# Patient Record
Sex: Female | Born: 1965 | Race: White | Hispanic: No | Marital: Married | Smoking: Never smoker
Health system: Southern US, Community
[De-identification: ages and names within clinical notes are randomized; demographics above are authoritative.]

## PROBLEM LIST (undated history)

## (undated) DIAGNOSIS — C801 Malignant (primary) neoplasm, unspecified: Secondary | ICD-10-CM

## (undated) DIAGNOSIS — E785 Hyperlipidemia, unspecified: Secondary | ICD-10-CM

## (undated) HISTORY — DX: Hyperlipidemia, unspecified: E78.5

## (undated) HISTORY — PX: CHOLECYSTECTOMY: SHX55

---

## 1997-09-01 ENCOUNTER — Other Ambulatory Visit: Admission: RE | Admit: 1997-09-01 | Discharge: 1997-09-01 | Payer: Self-pay | Admitting: Obstetrics and Gynecology

## 1998-11-30 ENCOUNTER — Other Ambulatory Visit: Admission: RE | Admit: 1998-11-30 | Discharge: 1998-11-30 | Payer: Self-pay | Admitting: Obstetrics and Gynecology

## 2000-03-07 ENCOUNTER — Other Ambulatory Visit: Admission: RE | Admit: 2000-03-07 | Discharge: 2000-03-07 | Payer: Self-pay | Admitting: Obstetrics and Gynecology

## 2001-04-30 ENCOUNTER — Other Ambulatory Visit: Admission: RE | Admit: 2001-04-30 | Discharge: 2001-04-30 | Payer: Self-pay | Admitting: Obstetrics and Gynecology

## 2002-05-06 ENCOUNTER — Other Ambulatory Visit: Admission: RE | Admit: 2002-05-06 | Discharge: 2002-05-06 | Payer: Self-pay | Admitting: Obstetrics and Gynecology

## 2003-04-01 ENCOUNTER — Other Ambulatory Visit: Admission: RE | Admit: 2003-04-01 | Discharge: 2003-04-01 | Payer: Self-pay | Admitting: Obstetrics and Gynecology

## 2004-07-20 ENCOUNTER — Other Ambulatory Visit: Admission: RE | Admit: 2004-07-20 | Discharge: 2004-07-20 | Payer: Self-pay | Admitting: Obstetrics and Gynecology

## 2010-04-25 ENCOUNTER — Other Ambulatory Visit: Payer: Self-pay | Admitting: Obstetrics and Gynecology

## 2010-04-25 DIAGNOSIS — N6323 Unspecified lump in the left breast, lower outer quadrant: Secondary | ICD-10-CM

## 2010-04-26 ENCOUNTER — Ambulatory Visit
Admission: RE | Admit: 2010-04-26 | Discharge: 2010-04-26 | Disposition: A | Discharge status: Home / Self Care | Payer: BC Managed Care – PPO | Source: Ambulatory Visit | Attending: Obstetrics and Gynecology | Admitting: Obstetrics and Gynecology

## 2010-04-26 ENCOUNTER — Other Ambulatory Visit: Payer: Self-pay | Admitting: Obstetrics and Gynecology

## 2010-04-26 DIAGNOSIS — N6323 Unspecified lump in the left breast, lower outer quadrant: Secondary | ICD-10-CM

## 2010-04-27 ENCOUNTER — Other Ambulatory Visit: Payer: Self-pay | Admitting: Obstetrics and Gynecology

## 2010-04-27 DIAGNOSIS — N6323 Unspecified lump in the left breast, lower outer quadrant: Secondary | ICD-10-CM

## 2010-04-27 DIAGNOSIS — R922 Inconclusive mammogram: Secondary | ICD-10-CM

## 2010-05-20 ENCOUNTER — Ambulatory Visit
Admission: RE | Admit: 2010-05-20 | Discharge: 2010-05-20 | Disposition: A | Discharge status: Home / Self Care | Payer: BC Managed Care – PPO | Source: Ambulatory Visit | Attending: Obstetrics and Gynecology | Admitting: Obstetrics and Gynecology

## 2010-05-20 DIAGNOSIS — R922 Inconclusive mammogram: Secondary | ICD-10-CM

## 2010-05-20 DIAGNOSIS — N6323 Unspecified lump in the left breast, lower outer quadrant: Secondary | ICD-10-CM

## 2010-05-20 MED ORDER — GADOBENATE DIMEGLUMINE 529 MG/ML IV SOLN
14.0000 mL | Freq: Once | INTRAVENOUS | Status: AC | PRN
  Administered 2010-05-20: 14 mL via INTRAVENOUS

## 2010-05-22 ENCOUNTER — Other Ambulatory Visit: Payer: Self-pay | Admitting: Obstetrics and Gynecology

## 2010-05-22 DIAGNOSIS — R9389 Abnormal findings on diagnostic imaging of other specified body structures: Secondary | ICD-10-CM

## 2010-05-25 ENCOUNTER — Other Ambulatory Visit: Payer: Self-pay | Admitting: Diagnostic Radiology

## 2010-05-25 ENCOUNTER — Ambulatory Visit
Admission: RE | Admit: 2010-05-25 | Discharge: 2010-05-25 | Disposition: A | Discharge status: Home / Self Care | Payer: BC Managed Care – PPO | Source: Ambulatory Visit | Attending: Obstetrics and Gynecology | Admitting: Obstetrics and Gynecology

## 2010-05-25 ENCOUNTER — Other Ambulatory Visit: Payer: Self-pay | Admitting: Obstetrics and Gynecology

## 2010-05-25 DIAGNOSIS — R9389 Abnormal findings on diagnostic imaging of other specified body structures: Secondary | ICD-10-CM

## 2010-05-26 ENCOUNTER — Ambulatory Visit
Admission: RE | Admit: 2010-05-26 | Discharge: 2010-05-26 | Disposition: A | Discharge status: Home / Self Care | Payer: BC Managed Care – PPO | Source: Ambulatory Visit | Attending: Obstetrics and Gynecology | Admitting: Obstetrics and Gynecology

## 2010-05-26 DIAGNOSIS — R9389 Abnormal findings on diagnostic imaging of other specified body structures: Secondary | ICD-10-CM

## 2010-06-06 ENCOUNTER — Other Ambulatory Visit (INDEPENDENT_AMBULATORY_CARE_PROVIDER_SITE_OTHER): Payer: Self-pay | Admitting: General Surgery

## 2010-06-06 DIAGNOSIS — N63 Unspecified lump in unspecified breast: Secondary | ICD-10-CM

## 2010-06-23 ENCOUNTER — Encounter (HOSPITAL_BASED_OUTPATIENT_CLINIC_OR_DEPARTMENT_OTHER)
Admission: RE | Admit: 2010-06-23 | Discharge: 2010-06-23 | Disposition: A | Discharge status: Home / Self Care | Payer: BC Managed Care – PPO | Source: Ambulatory Visit | Attending: General Surgery | Admitting: General Surgery

## 2010-06-23 LAB — DIFFERENTIAL
Basophils Relative: 0 % (ref 0–1)
Monocytes Absolute: 0.6 10*3/uL (ref 0.1–1.0)
Monocytes Relative: 8 % (ref 3–12)
Neutro Abs: 4.3 10*3/uL (ref 1.7–7.7)

## 2010-06-23 LAB — BASIC METABOLIC PANEL
BUN: 13 mg/dL (ref 6–23)
Calcium: 9.7 mg/dL (ref 8.4–10.5)
GFR calc non Af Amer: >60 mL/min (ref >60)
Glucose, Bld: 94 mg/dL (ref 70–99)
Sodium: 138 mEq/L (ref 135–145)

## 2010-06-23 LAB — CBC
HCT: 37 % (ref 36.0–46.0)
Hemoglobin: 12.7 g/dL (ref 12.0–15.0)
MCH: 30.2 pg (ref 26.0–34.0)
MCHC: 34.3 g/dL (ref 30.0–36.0)

## 2010-06-28 ENCOUNTER — Ambulatory Visit
Admission: RE | Admit: 2010-06-28 | Discharge: 2010-06-28 | Disposition: A | Discharge status: Home / Self Care | Payer: BC Managed Care – PPO | Source: Ambulatory Visit | Attending: General Surgery | Admitting: General Surgery

## 2010-06-28 ENCOUNTER — Ambulatory Visit (HOSPITAL_BASED_OUTPATIENT_CLINIC_OR_DEPARTMENT_OTHER)
Admission: RE | Admit: 2010-06-28 | Discharge: 2010-06-28 | Disposition: A | Discharge status: Home / Self Care | Payer: BC Managed Care – PPO | Source: Ambulatory Visit | Attending: General Surgery | Admitting: General Surgery

## 2010-06-28 ENCOUNTER — Other Ambulatory Visit (INDEPENDENT_AMBULATORY_CARE_PROVIDER_SITE_OTHER): Payer: Self-pay | Admitting: General Surgery

## 2010-06-28 DIAGNOSIS — Z01812 Encounter for preprocedural laboratory examination: Secondary | ICD-10-CM | POA: Insufficient documentation

## 2010-06-28 DIAGNOSIS — D249 Benign neoplasm of unspecified breast: Secondary | ICD-10-CM

## 2010-06-28 DIAGNOSIS — N63 Unspecified lump in unspecified breast: Secondary | ICD-10-CM

## 2010-06-28 HISTORY — PX: BREAST BIOPSY: SHX20

## 2010-06-30 ENCOUNTER — Telehealth (INDEPENDENT_AMBULATORY_CARE_PROVIDER_SITE_OTHER): Payer: Self-pay | Admitting: General Surgery

## 2010-06-30 NOTE — Telephone Encounter (Signed)
Message copied by Ethlyn Gallery on Fri Jun 30, 2010  9:34 AM ------      Message from: [place], [place]      Created: Fri Jun 30, 2010  7:06 AM      Regarding: path report       Elease Hashimoto,      Would you please call Mrs. Speelman today and tell her path is fine.      Thanks,      MW      ----- Message -----         From: [facility] [place] Interface         Sent: 06/29/2010   3:31 PM           To: Emelia Loron, MD

## 2010-06-30 NOTE — Telephone Encounter (Signed)
Called pt to notify her of the pathology report which is benign per Dr Dwain Sarna. [facility] 06-30-10

## 2010-07-03 NOTE — Op Note (Signed)
NAMEJULIANI, LADUKE NO.:  [id number]  MEDICAL RECORD NO.:  [id number]  LOCATION:                                 FACILITY:  PHYSICIAN:  Juanetta Gosling, MDDATE OF BIRTH:  03-01-1965  DATE OF PROCEDURE:  06/28/2010 DATE OF DISCHARGE:                              OPERATIVE REPORT   PREOPERATIVE DIAGNOSIS:  Left breast mass.  POSTOPERATIVE DIAGNOSIS:  Left breast mass.  PROCEDURE:  Left breast wire localization biopsy.  SURGEON:  Troy Sine. Dwain Sarna, MD  ASSISTANT:  None.  ANESTHESIA:  General.  SPECIMENS:  Left breast marked short stitch superior, long stitch lateral, and double stitch deep.  DISPOSITION OF SPECIMEN:  Pathology.  ESTIMATED BLOOD LOSS:  Minimal.  COMPLICATIONS:  None.  DRAINS:  None.  DISPOSITION:  To recovery room in stable condition.  INDICATIONS:  This is a 45 year old female who was examined by Dr. Tenny Craw in April and was noted to have a left breast at that time.  She was referred to the [facility] where she underwent a negative mammogram. An ultrasound showed questionable shadowing in the 4 o'clock position 3 cm from the left nipple.  She underwent an MRI due to very dense breast tissue and her MRI was essentially negative except for a suspicious area mass like enhancement at the 6 o'clock location.  A second look ultrasound did show an area that was present there and it was felt to correspond to the area of palpable firmness.  She underwent ultrasound- guided vacuum-assisted core biopsy with clip placement showing benign breast parenchyma.  She was referred for discordance between the biopsy results as well as the appearance on physical exam and the imaging characteristics.  I have discussed a wire localization biopsy with her.  PROCEDURE IN DETAIL:  After informed consent was taken, the patient was taken to the operating room.  She had first had a wire placed by Dr. Laveda Abbe and I had the mammograms and description  available for my review in the operating room.  She had an X over the lesion marked by ultrasound as well.  She was administered 1 gram of intravenous cefazolin.  Sequential compression devices were placed on lower extremities prior to induction with anesthesia.  She was then placed under general anesthesia with an LMA.  Her left breast was prepped and draped in a standard sterile surgical fashion.  A surgical time-out was then performed.  I infiltrated 0.25% Marcaine throughout the lower outer quadrant of her left breast.  I then made a radial incision.  I pulled the wire in remotely.  I then used cautery to remove the entire lesion as well as the wire.  The lesion was essentially palpable and this was all removed as well as the wire.  This was marked as above.  I then took a Faxitron mammogram.  This confirmed removal of the clip and the lesion as well as the wire.  This was also confirmed by Dr. Si Gaul.  I then obtained hemostasis.  I closed this with 2-0 Vicryl, 3-0 Vicryl, and 4-0 Monocryl.  Steri-Strips were placed and a sterile dressing was placed. I infiltrated an additional 10 mL  of 0.25% Marcaine.  She tolerated this well, was extubated in the operating room, and transferred to the recovery room in stable condition.     Juanetta Gosling, MD     MCW/MEDQ  D:  06/28/2010  T:  06/29/2010  Job:  [id number]  cc:   Miguel Aschoff, M.D. Rosendo Gros, MD  Electronically Signed by Emelia Loron MD on 07/03/2010 07:04:20 PM

## 2010-07-10 ENCOUNTER — Encounter (INDEPENDENT_AMBULATORY_CARE_PROVIDER_SITE_OTHER): Payer: Self-pay | Admitting: General Surgery

## 2010-07-11 ENCOUNTER — Ambulatory Visit (INDEPENDENT_AMBULATORY_CARE_PROVIDER_SITE_OTHER): Payer: BC Managed Care – PPO | Admitting: General Surgery

## 2010-07-11 ENCOUNTER — Encounter (INDEPENDENT_AMBULATORY_CARE_PROVIDER_SITE_OTHER): Payer: Self-pay | Admitting: General Surgery

## 2010-07-11 DIAGNOSIS — Z09 Encounter for follow-up examination after completed treatment for conditions other than malignant neoplasm: Secondary | ICD-10-CM

## 2010-07-11 DIAGNOSIS — N6320 Unspecified lump in the left breast, unspecified quadrant: Secondary | ICD-10-CM | POA: Insufficient documentation

## 2010-07-11 DIAGNOSIS — N63 Unspecified lump in unspecified breast: Secondary | ICD-10-CM

## 2010-07-11 NOTE — Progress Notes (Signed)
Subjective:     Patient ID: Patricia Larson, female   DOB: 01-29-1965, 45 y.o.   MRN: [id number]    There were no vitals taken for this visit.    HPI This is a 45 year old female who was noted to have a left breast mass. She underwent an evaluation at the breast center. She ended up having a left breast mass with a core biopsy that was not concordance with the appearance on physical exam her imaging. I then took her to the operating room and performed a left breast wire localization biopsy. Her pathology returns as an intraductal papilloma with calcifications and no atypia or malignancy. She was doing well postoperatively without any real complaints.  Review of Systems     Objective:   Physical [place] healing left breast incision with steri-strips still in place     Assessment:     S/p left breast biopsy for intraductal papilloma    Plan:     We discussed that she does have a benign biopsy. I told her that her Steri-Strips will come off for the next few weeks. She can then start massaging and her incision with some cocoa butter this was smoothed out over the next couple months. I released her to full activity. I'd told her that she needs to continue her regular breast cancer screening. This would include monthly self breast exams, yearly clinical exams and her  annual mammograms. She's got a call me if she has any questions or any problems.

## 2011-04-18 ENCOUNTER — Other Ambulatory Visit: Payer: Self-pay | Admitting: Obstetrics and Gynecology

## 2011-04-18 DIAGNOSIS — Z1231 Encounter for screening mammogram for malignant neoplasm of breast: Secondary | ICD-10-CM

## 2011-04-27 ENCOUNTER — Other Ambulatory Visit: Payer: Self-pay | Admitting: Obstetrics and Gynecology

## 2011-04-27 ENCOUNTER — Ambulatory Visit
Admission: RE | Admit: 2011-04-27 | Discharge: 2011-04-27 | Disposition: A | Discharge status: Home / Self Care | Payer: BC Managed Care – PPO | Source: Ambulatory Visit | Attending: Obstetrics and Gynecology | Admitting: Obstetrics and Gynecology

## 2011-04-27 DIAGNOSIS — Z1231 Encounter for screening mammogram for malignant neoplasm of breast: Secondary | ICD-10-CM

## 2012-04-22 ENCOUNTER — Other Ambulatory Visit: Payer: Self-pay

## 2012-04-22 DIAGNOSIS — Z1231 Encounter for screening mammogram for malignant neoplasm of breast: Secondary | ICD-10-CM

## 2012-05-15 ENCOUNTER — Other Ambulatory Visit: Payer: Self-pay | Admitting: Obstetrics and Gynecology

## 2012-05-15 ENCOUNTER — Ambulatory Visit
Admission: RE | Admit: 2012-05-15 | Discharge: 2012-05-15 | Disposition: A | Discharge status: Home / Self Care | Payer: BC Managed Care – PPO | Source: Ambulatory Visit

## 2012-05-15 DIAGNOSIS — Z1231 Encounter for screening mammogram for malignant neoplasm of breast: Secondary | ICD-10-CM

## 2013-05-20 ENCOUNTER — Other Ambulatory Visit: Payer: Self-pay | Admitting: Obstetrics and Gynecology

## 2015-01-02 DIAGNOSIS — C801 Malignant (primary) neoplasm, unspecified: Secondary | ICD-10-CM

## 2015-01-02 HISTORY — DX: Malignant (primary) neoplasm, unspecified: C80.1

## 2015-06-20 ENCOUNTER — Other Ambulatory Visit: Payer: Self-pay | Admitting: Obstetrics and Gynecology

## 2015-06-21 LAB — CYTOLOGY - PAP

## 2015-06-28 ENCOUNTER — Other Ambulatory Visit: Payer: Self-pay | Admitting: Obstetrics and Gynecology

## 2015-06-28 DIAGNOSIS — R928 Other abnormal and inconclusive findings on diagnostic imaging of breast: Secondary | ICD-10-CM

## 2015-07-04 ENCOUNTER — Ambulatory Visit
Admission: RE | Admit: 2015-07-04 | Discharge: 2015-07-04 | Disposition: A | Discharge status: Home / Self Care | Payer: Managed Care, Other (non HMO) | Source: Ambulatory Visit | Attending: Obstetrics and Gynecology | Admitting: Obstetrics and Gynecology

## 2015-07-04 ENCOUNTER — Other Ambulatory Visit: Payer: Self-pay | Admitting: Obstetrics and Gynecology

## 2015-07-04 DIAGNOSIS — N6320 Unspecified lump in the left breast, unspecified quadrant: Secondary | ICD-10-CM

## 2015-07-04 DIAGNOSIS — R928 Other abnormal and inconclusive findings on diagnostic imaging of breast: Secondary | ICD-10-CM

## 2015-07-13 ENCOUNTER — Other Ambulatory Visit: Payer: Self-pay | Admitting: General Surgery

## 2015-07-13 DIAGNOSIS — N63 Unspecified lump in unspecified breast: Secondary | ICD-10-CM

## 2015-07-13 DIAGNOSIS — C50912 Malignant neoplasm of unspecified site of left female breast: Secondary | ICD-10-CM

## 2015-07-15 ENCOUNTER — Telehealth: Payer: Self-pay | Admitting: Hematology

## 2015-07-15 ENCOUNTER — Telehealth: Payer: Self-pay | Admitting: *Deleted

## 2015-07-15 ENCOUNTER — Encounter: Payer: Self-pay | Admitting: Hematology

## 2015-07-15 ENCOUNTER — Ambulatory Visit
Admission: RE | Admit: 2015-07-15 | Discharge: 2015-07-15 | Disposition: A | Discharge status: Home / Self Care | Payer: Managed Care, Other (non HMO) | Source: Ambulatory Visit | Attending: General Surgery | Admitting: General Surgery

## 2015-07-15 DIAGNOSIS — N63 Unspecified lump in unspecified breast: Secondary | ICD-10-CM

## 2015-07-15 DIAGNOSIS — C50912 Malignant neoplasm of unspecified site of left female breast: Secondary | ICD-10-CM

## 2015-07-15 MED ORDER — GADOBENATE DIMEGLUMINE 529 MG/ML IV SOLN
15.0000 mL | Freq: Once | INTRAVENOUS | Status: AC | PRN
  Administered 2015-07-15: 15 mL via INTRAVENOUS

## 2015-07-15 NOTE — Telephone Encounter (Signed)
Left vm concerning genetics appt on 07/19/15 at 10am. Contact information provided for return call.

## 2015-07-15 NOTE — Telephone Encounter (Signed)
Appointment with Burr Medico on 7/18 at 2:30pm. Patient aware to arrive at 2:00pm, demographics verified. Location and address given to the pt.Letter to the referring

## 2015-07-19 ENCOUNTER — Ambulatory Visit (HOSPITAL_BASED_OUTPATIENT_CLINIC_OR_DEPARTMENT_OTHER): Payer: Managed Care, Other (non HMO) | Admitting: Genetic Counselor

## 2015-07-19 ENCOUNTER — Other Ambulatory Visit: Payer: Managed Care, Other (non HMO)

## 2015-07-19 ENCOUNTER — Encounter: Payer: Managed Care, Other (non HMO) | Admitting: Genetic Counselor

## 2015-07-19 ENCOUNTER — Encounter: Payer: Self-pay | Admitting: Hematology

## 2015-07-19 ENCOUNTER — Ambulatory Visit (HOSPITAL_BASED_OUTPATIENT_CLINIC_OR_DEPARTMENT_OTHER): Payer: Managed Care, Other (non HMO) | Admitting: Hematology

## 2015-07-19 VITALS — BP 119/69 | HR 85 | Temp 98.4°F | Resp 17 | Ht 63.0 in | Wt 168.3 lb

## 2015-07-19 DIAGNOSIS — Z803 Family history of malignant neoplasm of breast: Secondary | ICD-10-CM

## 2015-07-19 DIAGNOSIS — Z315 Encounter for genetic counseling: Secondary | ICD-10-CM

## 2015-07-19 DIAGNOSIS — C50512 Malignant neoplasm of lower-outer quadrant of left female breast: Secondary | ICD-10-CM

## 2015-07-19 DIAGNOSIS — Z801 Family history of malignant neoplasm of trachea, bronchus and lung: Secondary | ICD-10-CM

## 2015-07-19 DIAGNOSIS — Z8041 Family history of malignant neoplasm of ovary: Secondary | ICD-10-CM | POA: Diagnosis not present

## 2015-07-19 DIAGNOSIS — Z170 Estrogen receptor positive status [ER+]: Secondary | ICD-10-CM

## 2015-07-19 DIAGNOSIS — Z809 Family history of malignant neoplasm, unspecified: Secondary | ICD-10-CM

## 2015-07-19 DIAGNOSIS — Z8042 Family history of malignant neoplasm of prostate: Secondary | ICD-10-CM

## 2015-07-19 NOTE — Progress Notes (Signed)
[place]  [phone]) [phone] Fax:([phone]) [place] Note   Patient Care Team: Vanessa Kick, MD as PCP - General (Obstetrics and Gynecology) 07/19/2015  Referring physician: Dr. 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   07/15/2015 Imaging breast MRI w wo contrast showed biopsy proven malignancy in the lower outer left breast measures 1 x 1 x 0.7 cm. No findings to suggest multifocal or multicentric disease. Right breast was negative.     HISTORY OF PRESENTING ILLNESS:  Patricia Larson 50 y.o. female is here because of Her recently diagnosed breast cancer. She is accompanied by her husband to my clinic today. She was referred by her breast surgeon Dr. Donne Hazel.   She had a begin left lesion (pallioma) in 2012, which was removed by Dr. Donne Hazel. She has been doing well since then, had a screening mammogram every year. On the recent screening mammogram in early July, asymmetrically architecture distortion in the left outer quadrant of the left breast was found. She underwent diagnostic  mammogram and ultrasound, which showed a 0.6 cm in mass in the prior surgical site, 3 cm from the nipple. Axillary was negative for lymphadenopathy. She underwent core needle biopsy which showed invasive ductal carcinoma, ER/PR strongly positive, HER-2 negative, Ki-67 15%.  She feels well, no symptoms. She has mild pain in right elbow in the past few weeks, no other pain symptoms. She has good appetite and energy level, weight stable.   GYN HISTORY  Menarchal: 11 LMP: 07/08/2015 Contraceptive: 28 stopped in 2012  HRT: n/a  G0P0:    MEDICAL HISTORY:  Past Medical History  Diagnosis Date  . Hyperlipidemia     SURGICAL HISTORY: Past Surgical History  Procedure Laterality Date  . Breast biopsy  06/28/10    left  . Cholecystectomy      SOCIAL HISTORY: Social History   Social History  . Marital Status: Married    Spouse Name: N/A  . Number of Children: N/A  . Years of Education: N/A   Occupational History  . Not on file.   Social History Main Topics  . Smoking status: Never Smoker   . Smokeless tobacco: Not on file  . Alcohol Use: 1.5 oz/week    3 drink(s) per week  . Drug Use: Not on file  . Sexual Activity: Not on file   Other Topics Concern  . Not on file   Social History Narrative  . No narrative on file    FAMILY HISTORY: Family History  Problem Relation Age of Onset  . Prostate cancer Father   . Heart failure  Mother   . Hypertension Mother   . Heart disease Brother   . Diabetes Brother   . Hypertension Sister     ALLERGIES:  has No Known Allergies.  MEDICATIONS:  Current Outpatient Prescriptions  Medication Sig Dispense Refill  . Multiple Vitamin (MULTIVITAMIN PO) Take by mouth daily.      . rosuvastatin (CRESTOR) 5 MG tablet Take 5 mg by mouth daily.       No current facility-administered medications for this visit.     REVIEW OF SYSTEMS:   Constitutional: Denies fevers, chills or abnormal night sweats Eyes: Denies blurriness of vision,  double vision or watery eyes Ears, nose, mouth, throat, and face: Denies mucositis or sore throat Respiratory: Denies cough, dyspnea or wheezes Cardiovascular: Denies palpitation, chest discomfort or lower extremity swelling Gastrointestinal:  Denies nausea, heartburn or change in bowel habits Skin: Denies abnormal skin rashes Lymphatics: Denies new lymphadenopathy or easy bruising Neurological:Denies numbness, tingling or new weaknesses Behavioral/Psych: Mood is stable, no new changes  All other systems were reviewed with the patient and are negative.  PHYSICAL EXAMINATION: ECOG PERFORMANCE STATUS: 0 - Asymptomatic  Filed Vitals:   07/19/15 1446  BP: 119/69  Pulse: 85  Temp: 98.4 F (36.9 C)  Resp: 17   Filed Weights   07/19/15 1446  Weight: 168 lb 4.8 oz (76.34 kg)    GENERAL:alert, no distress and comfortable SKIN: skin color, texture, turgor are normal, no rashes or significant lesions EYES: normal, conjunctiva are pink and non-injected, sclera clear OROPHARYNX:no exudate, no erythema and lips, buccal mucosa, and tongue normal  NECK: supple, thyroid normal size, non-tender, without nodularity LYMPH:  no palpable lymphadenopathy in the cervical, axillary or inguinal LUNGS: clear to auscultation and percussion with normal breathing effort HEART: regular rate & rhythm and no murmurs and no lower extremity edema ABDOMEN:abdomen soft, non-tender and normal bowel sounds Musculoskeletal:no cyanosis of digits and no clubbing  PSYCH: alert & oriented x 3 with fluent speech NEURO: no focal motor/sensory deficits Breasts: Breast inspection showed them to be symmetrical with no nipple discharge. There is a old surgical scar in the lower outer quadrant of left breast, and a 1 cm palpable mass behind the scar. Palpation of the right breast and axilla revealed no obvious mass that I could appreciate.  LABORATORY DATA:  I have reviewed the data as listed CBC Latest Ref Rng 06/28/2010  06/23/2010  WBC 4.0 - 10.5 K/uL - 7.1  Hemoglobin 12.0 - 15.0 g/dL 14.1 12.7  Hematocrit 36.0 - 46.0 % - 37.0  Platelets 150 - 400 K/uL - 242   CMP Latest Ref Rng 06/23/2010  Glucose 70 - 99 mg/dL 94  BUN 6 - 23 mg/dL 13  Creatinine 0.50 - 1.10 mg/dL 0.56  Sodium 135 - 145 mEq/L 138  Potassium 3.5 - 5.1 mEq/L 4.0  Chloride 96 - 112 mEq/L 100  CO2 19 - 32 mEq/L 30  Calcium 8.4 - 10.5 mg/dL 9.7   PATHOLOGY REPORTS: Diagnosis 07/04/2015 Breast, left, needle core biopsy, lower outer quadrant - INVASIVE DUCTAL CARCINOMA. - DUCTAL CARCINOMA IN SITU. - SEE COMMENT. Microscopic Comment The carcinoma appears grade 1. A breast prognostic profile will be performed and the results reported separately. The results were called to the [place] on 07/06/2015. (JBK:kh 07/06/15) Results: IMMUNOHISTOCHEMICAL AND MORPHOMETRIC ANALYSIS PERFORMED MANUALLY Estrogen Receptor: 100%, POSITIVE, STRONG STAINING INTENSITY Progesterone Receptor: 80%, POSITIVE, STRONG STAINING INTENSITY Proliferation Marker Ki67: 15%  Results: HER2 - NEGATIVE RATIO OF HER2/CEP17 SIGNALS 1.09  AVERAGE HER2 COPY NUMBER PER CELL 1.90   RADIOGRAPHIC STUDIES: I have personally reviewed the radiological images as listed and agreed with the findings in the report. Mr Breast Bilateral W Wo Contrast  07/15/2015  CLINICAL DATA:  50 year old female with recently diagnosed invasive ductal carcinoma of the left breast post stereotactic guided biopsy of an area of distortion in the outer left breast 07/04/2015. History of benign left breast excision in 2012. LABS:  Not applicable EXAM: BILATERAL BREAST MRI WITH AND WITHOUT CONTRAST TECHNIQUE: Multiplanar, multisequence MR images of both breasts were obtained prior to and following the intravenous administration of 15 ml of MultiHance. THREE-DIMENSIONAL MR IMAGE RENDERING ON INDEPENDENT WORKSTATION: Three-dimensional MR images were rendered by post-processing of the original MR  data on an independent workstation. The three-dimensional MR images were interpreted, and findings are reported in the following complete MRI report for this study. Three dimensional images were evaluated at the independent DynaCad workstation COMPARISON:  Previous exam(s). FINDINGS: Breast composition: c.  Heterogeneous fibroglandular tissue. Background parenchymal enhancement: Moderate. Right breast: No suspicious rapidly enhancing masses or abnormal areas of enhancement seen in the right breast to suggest malignancy. Left breast: Irregular enhancing mass with associated distortion in the lower outer left breast at site of biopsy proven malignancy measures 1 x 1 x 0.7 cm. Biopsy marker clip appears to be located along the posterior medial margin of the biopsied malignancy. No additional enhancing masses or abnormal areas of enhancement seen in the left breast to suggest multifocal or multicentric disease. Lymph nodes: No morphologically abnormal axillary lymph nodes. No internal mammary lymphadenopathy. Ancillary findings:  None. IMPRESSION: 1. Biopsy proven malignancy in the lower outer left breast measures 1 x 1 x 0.7 cm. No findings to suggest multifocal or multicentric disease. 2.  No MRI evidence of malignancy in the right breast. RECOMMENDATION: Treatment plan for known left breast malignancy. BI-RADS CATEGORY  6: Known biopsy-proven malignancy. Electronically Signed   By: Everlean Alstrom M.D.   On: 07/15/2015 12:04   [place] Extrem Up Left Ltd  07/15/2015  CLINICAL DATA:  Sonographic staging evaluation of the left axilla in a patient with a recent diagnosis of left breast cancer, pathology grade 1 invasive ductal carcinoma and DCIS. EXAM: ULTRASOUND OF THE LEFT AXILLA COMPARISON:  Bilateral breast MRI performed earlier today is correlated. Mammography 06/20/2015, 06/02/2014 and earlier. FINDINGS: On physical exam, there is no palpable lymphadenopathy in the left axilla. Ultrasound is performed, showing no  pathologic left axillary lymphadenopathy. IMPRESSION: No pathologic left axillary lymphadenopathy. RECOMMENDATION: Treatment plan. I have discussed the findings and recommendations with the patient. Results were also provided in writing at the conclusion of the visit. BI-RADS CATEGORY  1: Negative Electronically Signed   By: Evangeline Dakin M.D.   On: 07/15/2015 15:48   US Breast Ltd Uni Left Inc Axilla  07/07/2015  ADDENDUM REPORT: 07/07/2015 07:49 ADDENDUM: Targeted ultrasound of the left axilla demonstrates no evidence of lymphadenopathy. Electronically Signed   By: Fidela Salisbury M.D.   On: 07/07/2015 07:49  07/07/2015  CLINICAL DATA:  Left breast lower outer quadrant architectural distortion seen on most recent screening mammography. History of excisional biopsy in the same quadrant of the breast in 2012. EXAM: 2D DIGITAL DIAGNOSTIC LEFT MAMMOGRAM WITH CAD AND ADJUNCT TOMO ULTRASOUND LEFT BREAST COMPARISON:  Previous exam(s). ACR Breast Density Category c: The breast tissue is heterogeneously dense, which may obscure small masses. FINDINGS: Additional mammographic views of the left breast demonstrate persistent focal asymmetry/architectural distortion in the left  breast slightly lower outer quadrant, middle depth. Mammographic images were processed with CAD. On physical exam, there is approximately 4 cm skin scarring in the left 4 o'clock breast, middle depth. Linear hardening is felt slightly inferior to the scar. Targeted ultrasound is performed, showing left breast 4 o'clock 3 cm from the nipple hypoechoic irregular mass measuring 0.8 x 0.6 x 0.6 cm. This finding is immediately adjacent to the area of surgical scarring and it is difficult to discern whether it represents postsurgical changes or a newly developed spiculated mass. However, the area of architectural distortion seen mammographically is new an has not been present on patient's prior postsurgical mammograms. IMPRESSION: Left breast  slightly lower outer quadrant focal asymmetry/ architectural distortion immediately adjacent to area of prior postsurgical scarring, for which stereotactic core needle biopsy is recommended. RECOMMENDATION: Stereotactic core needle biopsy of the left breast. I have discussed the findings and recommendations with the patient. Results were also provided in writing at the conclusion of the visit. If applicable, a reminder letter will be sent to the patient regarding the next appointment. BI-RADS CATEGORY  4: Suspicious. Electronically Signed: By: Fidela Salisbury M.D. On: 07/04/2015 09:30   Mm Diag Breast Tomo Uni Left  07/07/2015  ADDENDUM REPORT: 07/07/2015 07:49 ADDENDUM: Targeted ultrasound of the left axilla demonstrates no evidence of lymphadenopathy. Electronically Signed   By: Fidela Salisbury M.D.   On: 07/07/2015 07:49  07/07/2015  CLINICAL DATA:  Left breast lower outer quadrant architectural distortion seen on most recent screening mammography. History of excisional biopsy in the same quadrant of the breast in 2012. EXAM: 2D DIGITAL DIAGNOSTIC LEFT MAMMOGRAM WITH CAD AND ADJUNCT TOMO ULTRASOUND LEFT BREAST COMPARISON:  Previous exam(s). ACR Breast Density Category c: The breast tissue is heterogeneously dense, which may obscure small masses. FINDINGS: Additional mammographic views of the left breast demonstrate persistent focal asymmetry/architectural distortion in the left breast slightly lower outer quadrant, middle depth. Mammographic images were processed with CAD. On physical exam, there is approximately 4 cm skin scarring in the left 4 o'clock breast, middle depth. Linear hardening is felt slightly inferior to the scar. Targeted ultrasound is performed, showing left breast 4 o'clock 3 cm from the nipple hypoechoic irregular mass measuring 0.8 x 0.6 x 0.6 cm. This finding is immediately adjacent to the area of surgical scarring and it is difficult to discern whether it represents postsurgical  changes or a newly developed spiculated mass. However, the area of architectural distortion seen mammographically is new an has not been present on patient's prior postsurgical mammograms. IMPRESSION: Left breast slightly lower outer quadrant focal asymmetry/ architectural distortion immediately adjacent to area of prior postsurgical scarring, for which stereotactic core needle biopsy is recommended. RECOMMENDATION: Stereotactic core needle biopsy of the left breast. I have discussed the findings and recommendations with the patient. Results were also provided in writing at the conclusion of the visit. If applicable, a reminder letter will be sent to the patient regarding the next appointment. BI-RADS CATEGORY  4: Suspicious. Electronically Signed: By: Fidela Salisbury M.D. On: 07/04/2015 09:30   Mm Diag Breast Tomo Uni Left  07/04/2015  CLINICAL DATA:  Post biopsy mammogram of the left breast for clip placement. EXAM: 3D DIAGNOSTIC LEFT MAMMOGRAM POST STEREOTACTIC BIOPSY COMPARISON:  Previous exam(s). FINDINGS: 3D Mammographic images were obtained following stereotactic guided biopsy of distortion in the lower outer left breast. The X shaped biopsy marking clip is appropriately positioned at the intended site of biopsy in the lower outer left breast.  IMPRESSION: Appropriate positioning of the X shaped biopsy marking clip in the lower outer left breast. Final Assessment: Post Procedure Mammograms for Marker Placement Electronically Signed   By: Ammie Ferrier M.D.   On: 07/04/2015 15:12   Mm Lt Breast Bx W Loc Dev 1st Lesion Image Bx Spec Stereo Guide  07/07/2015  ADDENDUM REPORT: 07/07/2015 07:59 ADDENDUM: Pathology revealed GRADE I INVASIVE DUCTAL CARCINOMA, DUCTAL CARCINOMA IN SITU of the lower outer quadrant of the Left breast. This was found to be concordant by Dr. Ammie Ferrier. Pathology results were discussed with the patient by telephone. The patient reported doing well after the biopsy with  tenderness, bruising and minimal bleeding at the site. Post biopsy instructions and care were reviewed and questions were answered. The patient was encouraged to call [facility] for any additional concerns. Surgical consultation has been arranged with Dr. Rolm Bookbinder at [facility] on July 12, 2015. Pathology results reported by Terie Purser, RN on 07/07/2015. Electronically Signed   By: Ammie Ferrier M.D.   On: 07/07/2015 07:59  07/07/2015  CLINICAL DATA:  50 year old female presenting for stereotactic biopsy of left breast distortion. EXAM: LEFT BREAST STEREOTACTIC CORE NEEDLE BIOPSY COMPARISON:  Previous exams. FINDINGS: The patient and I discussed the procedure of stereotactic-guided biopsy including benefits and alternatives. We discussed the high likelihood of a successful procedure. We discussed the risks of the procedure including infection, bleeding, tissue injury, clip migration, and inadequate sampling. Informed written consent was given. The usual time out protocol was performed immediately prior to the procedure. Using sterile technique and 1% Lidocaine as local anesthetic, under stereotactic guidance, a 11 gauge vacuum assisted device was used to perform core needle biopsy of distortion in the lower outer quadrant of the left breast using a lateral approach. At the conclusion of the procedure, a X shaped tissue marker clip was deployed into the biopsy cavity. Follow-up 2-view mammogram was performed and dictated separately. IMPRESSION: Stereotactic-guided biopsy of distortion in the lower outer left breast. No apparent complications. Electronically Signed: By: Ammie Ferrier M.D. On: 07/04/2015 15:13    ASSESSMENT & PLAN: 50 year old premenopausal Caucasian woman  1. 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 -Giving the relatively small size of the tumor, she is likely a candidate for breast lumpectomy. However she is very concerned about breast cancer recurrence, and has decided to have bilateral mastectomy with risk construction. She was seen by Dr. Donne Hazel -Based on her final surgical path, I may recommend a Oncotype Dx test on the surgical sample and we'll make a decision about adjuvant chemotherapy based on the Oncotype result. Written material of this test was given to her. She is young and fit, would be a candidate for chemotherapy if her Oncotype recurrence score is high. Based on her strongly ER/PR positive, HER-2 negative, low Ki-67, G1 disease, I predict her Oncotype recurrence score would likely be low. If her tumor is less than 1 cm with grade 1 on final surgical path, no other high risk features, I would not send Oncotype.  -Giving the strong ER and PR expression in her premenopausal status, I recommend adjuvant endocrine therapy with tamoxifen for a total of 10 years to reduce the risk of cancer recurrence. -We finally discussed breast cancer surveillance after her surgery, which includes self exam, and  routine office visits with lab and physical exam. She will not need mammogram after bilateral mastectomy. -I encouraged her to have healthy diet and exercise regularly   2. Genetics -She has strong family history of malignancy, she is scheduled to see a Dietitian dated today.  Plan -she will proceed with bilateral mastectomy and reconstruction -I'll review her final surgical path, and decide about Oncotype -I'll see her back 3-4 weeks after her breast surgery   All questions were answered. The patient knows to call the clinic with any problems, questions or concerns.  I spent 55 minutes counseling the patient face to face. The total time spent in the appointment was 60 minutes and more than 50% was on counseling.      Truitt Merle, MD 07/19/2015

## 2015-07-20 ENCOUNTER — Encounter: Payer: Self-pay | Admitting: Genetic Counselor

## 2015-07-20 DIAGNOSIS — Z803 Family history of malignant neoplasm of breast: Secondary | ICD-10-CM | POA: Insufficient documentation

## 2015-07-20 DIAGNOSIS — Z8041 Family history of malignant neoplasm of ovary: Secondary | ICD-10-CM | POA: Insufficient documentation

## 2015-07-20 NOTE — Progress Notes (Signed)
REFERRING PROVIDER: Rolm Bookbinder, MD  OTHER PROVIDER(S): Truitt Merle, MD  PRIMARY PROVIDER:  Marcial Pacas., MD  PRIMARY REASON FOR VISIT:  1. Breast cancer of lower-outer quadrant of left female breast ([place])   2. Family history of breast cancer in female   3. Family history of ovarian cancer   4. Family history of prostate cancer in father   36. Family history of lung cancer   6. Family history of cancer      HISTORY OF PRESENT ILLNESS:   Patricia Larson, a 50 y.o. female, was seen for a [facility] cancer genetics consultation at the request of Dr. Donne Hazel due to a personal history of breast cancer and family history of breast, prostate, ovarian, and other cancers.  Patricia Larson presents to clinic today with her husband to discuss the possibility of a hereditary predisposition to cancer, genetic testing, and to further clarify her future cancer risks, as well as potential cancer risks for family members.   In July 2017, at the age of 66, Patricia Larson was diagnosed with invasive ductal carcinoma with DCIS of the left breast.  Hormone receptor status was ER/PR+, Her2-. Patricia Larson has decided to undergo bilateral mastectomies and her surgery will be scheduled soon.  Genetic testing will help inform with decision as well as further treatment decisions.    CANCER HISTORY:  Oncology History   Breast cancer of lower-outer quadrant of left female breast [facility])   Staging form: Breast, AJCC 7th Edition     Clinical stage from 07/04/2015: Stage IA (T1b, N0, M0) - Signed by Truitt Merle, MD on 07/19/2015       Breast cancer of lower-outer quadrant of left female breast ([place])   07/04/2015 Initial Diagnosis Breast cancer of lower-outer quadrant of left female breast ([place])   07/04/2015 Receptors her2 Your 100% positive, PR 80% positive, HER-2 negative, Ki-67 15%   07/04/2015 Initial Biopsy Left breast mass biopsy showed invasive ductal carcinoma, DCIS, grade 1   07/04/2015 Mammogram diagnostic mammogram and US  showed a asymmetry architecture distortion in the lower outer quadrant of left breast, measuring 0.8 x 0.6 x 0.6 cm, 3 cm from the nipple, ultrasound of the left axilla was negative for lymphadenopathy.   07/15/2015 Imaging breast MRI w wo contrast showed biopsy proven malignancy in the lower outer left breast measures 1 x 1 x 0.7 cm. No findings to suggest multifocal or multicentric disease. Right breast was negative.     HORMONAL RISK FACTORS:  Menarche was at age 71.  First live birth at age - no children.  OCP use for approximately 30 years.  Ovaries intact: yes.  Hysterectomy: no.  Menopausal status: perimenopausal.  HRT use: Provera use for 5 days - stopped when breast cancer diagnosed. Colonoscopy: no; not examined. Mammogram within the last year: yes. Number of breast biopsies: 2. Up to date with pelvic exams:  yes. Any excessive radiation exposure/other exposures in the past:  Previously worked around radiation in a lab, but wore a badge that never showed excessive levels; reports some history of secondhand smoke exposure (sister smokes)  Past Medical History  Diagnosis Date  . Hyperlipidemia     Past Surgical History  Procedure Laterality Date  . Breast biopsy  06/28/10    left  . Cholecystectomy      Social History   Social History  . Marital Status: Married    Spouse Name: N/A  . Number of Children: N/A  . Years of Education: N/A  Social History Main Topics  . Smoking status: Never Smoker   . Smokeless tobacco: Never Used  . Alcohol Use: 1.8 oz/week    3 Standard drinks or equivalent per week     Comment: 1-2 a week; sometime only 1 per month - generally wine or occ beer  . Drug Use: None  . Sexual Activity: Not Asked   Other Topics Concern  . None   Social History Narrative     FAMILY HISTORY:  We obtained a detailed, 4-generation family history.  Significant diagnoses are listed below: Family History  Problem Relation Age of Onset  . Prostate  cancer Father 64    treated with surgery and hormonal pills; metastasis to bones at 66y  . Heart failure Mother   . Hypertension Mother   . Other Mother     hx of hysterectomy at age 9  . Hypertension Sister   . Other Sister     one sister had hysterectomy at 50 and BSO at 3 due to abnormal bleeding  . Ovarian cancer Maternal Aunt     dx late 60s-early 70s  . Breast cancer Paternal Aunt     dx. 65s  . Prostate cancer Paternal Uncle     dx. 45s  . Breast cancer Paternal Aunt     dx. 40-70  . Breast cancer Cousin     (x3) paternal 1st cousins dx. breast cancer in their 61s (all share the same parent)  . Lung cancer Paternal Uncle     d. 28s  . Heart Problems Brother     d. 47  . Cancer Cousin 51    maternal 1st cousin dx. NOS cancer  . Breast cancer Paternal Aunt     dx. 74s  . Prostate cancer Paternal Uncle     d. 13s  . Ovarian cancer Other     paternal great aunt (PGF's sister) d. gyn cancer - likely ovarian in her 34s-70  . Breast cancer Cousin 49    paternal 1st cousin, once-removed; (PGF's brother's daughter)    Patricia Larson has two full sisters, ages 54 and 19, who have never been diagnosed with cancer.  Her oldest sister had a hysterectomy at age 74 and a oophorectomy at 72 due to issues with abnormal bleeding.  Patricia Larson also had a full brother and he passed due to heart problems in his 72s.  Patricia Larson reports no cancer history for her nephews and niece.  Her mother died of heart failure at 74.  Her mother also had a history of a hysterectomy at the age of 63 due to unspecified issues she had been having.  Patricia Larson's father was diagnosed with prostate cancer at age 37 and was treated with surgery and hormonal pills, this later metastasized to his bones and he passed away at 64.    Patricia Larson's mother had two full sisters and four full brother.  One sister was diagnosed with and passed away from ovarian cancer in her late 23s-early 70s.  This sister had one son who was  diagnosed with an unspecified type of cancer at age 5.  Another sister died in her 11s and never had cancer.  One brother is currently 52 and cancer free.  One brother died of perhaps a childhood illness at age 9-6 years.  Two other brothers have also passed away, but did not have cancer to Patricia Larson's knowledge.  Patricia Larson has limited information for other maternal first cousins, as many of  them lived in other states.  Her maternal grandmother died at 53; her grandfather died at age 51.  Neither of her grandparents had cancer, to her knowledge.  She has no information for her maternal great aunts/uncles or great grandparents.    Patricia Larson's father had three full brothers and four full sisters.  Three sisters were diagnosed with breast cancer in their 50s-70 and have since passed away.  Two brothers were diagnosed with prostate cancers in their 60s-80s.  The other brother died of lung cancer in his 17s.  Patricia Larson has some limited information for her paternal first cousins, but is unaware of any cancer history.  Her paternal grandmother died of complications following a hysterectomy in her 31s.  Her paternal grandfather died of a gunshot wound.  He had one sister who died of a gynecological cancer (Patricia Larson. Dark believes most likely ovarian) in her 25s-70.  One brother to her grandfather had a daughter who was diagnosed with breast cancer at 42.  Patricia Larson has no information for other paternal relatives.  Patricia Larson is unaware of any previous family history of genetic testing for hereditary cancer.  Patient's maternal ancestors are of [place] descent, and paternal ancestors are of [place] descent. There is no reported Ashkenazi Jewish ancestry. There is no known consanguinity.  GENETIC COUNSELING ASSESSMENT: Patricia Larson is a 50 y.o. female with a personal and family history of cancer which is somewhat suggestive of a hereditary breast/ovarian cancer syndrome and predisposition to cancer. We, therefore,  discussed and recommended the following at today's visit.   DISCUSSION: We reviewed the characteristics, features and inheritance patterns of hereditary cancer syndromes. We also discussed genetic testing, including the appropriate family members to test, the process of testing, insurance coverage and turn-around-time for results. We discussed the implications of a negative, positive and/or variant of uncertain significant result. We recommended Patricia Larson pursue genetic testing for the 20-gene Breast/Ovarian Cancer Panel with MSH2 Exons 1-7 Inversion Analysis through [facility] [employer] [facility].  The Breast/Ovarian Cancer Panel offered by [facility] Hope Pigeon, MD) includes sequencing and deletion/duplication analysis for the following 19 genes:  ATM, BARD1, BRCA1, BRCA2, BRIP1, CDH1, CHEK2, FANCC, MLH1, MSH2, MSH6, NBN, PALB2, PMS2, PTEN, RAD51C, RAD51D, TP53, and XRCC2.  This panel also includes deletion/duplication analysis (without sequencing) for one gene, EPCAM.   Based on Patricia Larson. Uddin's personal and family history of cancer, she meets medical criteria for genetic testing. Despite that she meets criteria, she may still have an out of pocket cost. We discussed that if her out of pocket cost for testing is over $100, the laboratory will call and confirm whether she wants to proceed with testing.  If the out of pocket cost of testing is less than $100 she will be billed by the genetic testing laboratory.   PLAN: After considering the risks, benefits, and limitations, Patricia Larson  provided informed consent to pursue genetic testing and the blood sample was sent to [facility] for analysis of the 20-gene Breast/Ovarian Cancer Panel with MSH2 Exons 1-7 Inversion Analysis. Results should be available within approximately 2 weeks' time, at which point they will be disclosed by telephone to Patricia Larson. [place], as will any additional recommendations warranted by these results. Patricia Larson. Vanschaick will receive a summary  of her genetic counseling visit and a copy of her results once available. This information will also be available in Epic. We encouraged Patricia Larson. Derner to remain in contact with cancer genetics annually so that we can continuously update the family  history and inform her of any changes in cancer genetics and testing that may be of benefit for her family. Patricia Larson. Lisby's questions were answered to her satisfaction today. Our contact information was provided should additional questions or concerns arise.  Thank you for the referral and allowing [place] to share in the care of your patient.   Jeanine Luz, Patricia Larson, [facility] Certified Genetic Counselor [email or web address] Phone: [phone]  The patient was seen for a total of 60 minutes in face-to-face genetic counseling.  This patient was discussed with Drs. Magrinat, Lindi Adie and/or Burr Medico who agrees with the above.    _______________________________________________________________________ For Office Staff:  Number of people involved in session: 3 Was an Intern/ student involved with case: yes

## 2015-07-21 ENCOUNTER — Encounter: Payer: Self-pay | Admitting: Hematology

## 2015-07-21 ENCOUNTER — Telehealth: Payer: Self-pay | Admitting: *Deleted

## 2015-07-21 ENCOUNTER — Other Ambulatory Visit: Payer: Self-pay | Admitting: General Surgery

## 2015-07-21 DIAGNOSIS — C50512 Malignant neoplasm of lower-outer quadrant of left female breast: Secondary | ICD-10-CM

## 2015-07-21 NOTE — Telephone Encounter (Signed)
  Oncology Nurse Navigator Documentation  Navigator Location: [facility]-Med Onc (07/21/15 1100) Navigator Encounter Type: Introductory phone call (07/21/15 1100)   Abnormal Finding Date: 07/04/15 (07/21/15 1100) Confirmed Diagnosis Date: 07/04/15 (07/21/15 1100)     Patient Visit Type: MedOnc;Initial (07/21/15 1100) Treatment Phase: Pre-Tx/Tx Discussion (07/21/15 1100) Barriers/Navigation Needs: No barriers at this time (07/21/15 1100)                Acuity: Level 2 (07/21/15 1100)   Acuity Level 2: Initial guidance, education and coordination as needed;Educational needs;Assistance expediting appointments;Ongoing guidance and education throughout treatment as needed (07/21/15 1100)     Time Spent with Patient: 15 (07/21/15 1100)

## 2015-07-26 ENCOUNTER — Encounter: Payer: Self-pay | Admitting: Plastic Surgery

## 2015-08-01 ENCOUNTER — Telehealth: Payer: Self-pay | Admitting: Genetic Counselor

## 2015-08-01 ENCOUNTER — Other Ambulatory Visit: Payer: Self-pay | Admitting: General Surgery

## 2015-08-01 NOTE — Telephone Encounter (Signed)
Discussed with Patricia Larson that her genetic test results were negative for mutations within any of 20 genes on the Breast/Ovarian Cancer Panel through [facility].  Additionally, no uncertain changes were found.  Discussed that we still do not have an explanation for her personal or family history of breast cancer.  Most cancer is not genetic, but there is still a chance we could be missing something on our current testing options or that there could be a mutation in the family that explains the family history of breast cancer, that Patricia Larson herself just did not inherit.  Recommended that her paternal aunts and first cousins who have had breast cancer also have genetic testing, so that we can further elucidate the personal/famililial breast cancer risks.  Patricia Larson will discuss with these relatives.  Discussed that surgical/treatment decisions and future cancer screening should then be based upon personal and family history alone, since this negative result is not giving [place] any additional information.  Patricia Larson should continue to follow her doctors' recommendations.  She is welcome to call and update the family history with [place] in the future, especially if other relatives are diagnosed with cancer or end up undergoing genetic testing.  Women in the family are at an increased risk for breast and ovarian cancer, simply based on the family history.  They should make their primary docs aware of this history.

## 2015-08-02 ENCOUNTER — Ambulatory Visit: Payer: Self-pay | Admitting: Genetic Counselor

## 2015-08-02 DIAGNOSIS — Z1379 Encounter for other screening for genetic and chromosomal anomalies: Secondary | ICD-10-CM

## 2015-08-02 DIAGNOSIS — Z8042 Family history of malignant neoplasm of prostate: Secondary | ICD-10-CM

## 2015-08-02 DIAGNOSIS — Z8041 Family history of malignant neoplasm of ovary: Secondary | ICD-10-CM

## 2015-08-02 DIAGNOSIS — C50512 Malignant neoplasm of lower-outer quadrant of left female breast: Secondary | ICD-10-CM

## 2015-08-02 DIAGNOSIS — Z803 Family history of malignant neoplasm of breast: Secondary | ICD-10-CM

## 2015-08-09 ENCOUNTER — Encounter: Payer: Self-pay | Admitting: *Deleted

## 2015-08-14 ENCOUNTER — Telehealth: Payer: Self-pay | Admitting: Hematology

## 2015-08-14 NOTE — Telephone Encounter (Signed)
Lvm advising appt 9/18 @ 2.30pm also mailed calendar.

## 2015-08-17 ENCOUNTER — Encounter (HOSPITAL_BASED_OUTPATIENT_CLINIC_OR_DEPARTMENT_OTHER): Payer: Self-pay | Admitting: *Deleted

## 2015-08-18 NOTE — Progress Notes (Signed)
Pt given 8 oz carton of boost breeze with verbal and written instructions to drink morning of surgery at 7 am and no other liquids after midnight,  Teach back, pt and husband voiced understanding

## 2015-08-24 ENCOUNTER — Encounter (HOSPITAL_BASED_OUTPATIENT_CLINIC_OR_DEPARTMENT_OTHER): Payer: Self-pay | Admitting: *Deleted

## 2015-08-24 ENCOUNTER — Encounter (HOSPITAL_COMMUNITY)
Admission: RE | Admit: 2015-08-24 | Discharge: 2015-08-24 | Disposition: A | Discharge status: Home / Self Care | Payer: Managed Care, Other (non HMO) | Source: Ambulatory Visit | Attending: General Surgery | Admitting: General Surgery

## 2015-08-24 ENCOUNTER — Encounter (HOSPITAL_BASED_OUTPATIENT_CLINIC_OR_DEPARTMENT_OTHER)
Admission: RE | Disposition: A | Discharge status: Home / Self Care | Payer: Self-pay | Source: Ambulatory Visit | Attending: General Surgery

## 2015-08-24 ENCOUNTER — Ambulatory Visit (HOSPITAL_BASED_OUTPATIENT_CLINIC_OR_DEPARTMENT_OTHER)
Admission: RE | Admit: 2015-08-24 | Discharge: 2015-08-25 | Disposition: A | Discharge status: Home / Self Care | Payer: Managed Care, Other (non HMO) | Source: Ambulatory Visit | Attending: General Surgery | Admitting: General Surgery

## 2015-08-24 ENCOUNTER — Ambulatory Visit (HOSPITAL_BASED_OUTPATIENT_CLINIC_OR_DEPARTMENT_OTHER): Payer: Managed Care, Other (non HMO) | Admitting: Certified Registered"

## 2015-08-24 DIAGNOSIS — C50512 Malignant neoplasm of lower-outer quadrant of left female breast: Secondary | ICD-10-CM | POA: Insufficient documentation

## 2015-08-24 DIAGNOSIS — Z7982 Long term (current) use of aspirin: Secondary | ICD-10-CM | POA: Diagnosis not present

## 2015-08-24 DIAGNOSIS — Z803 Family history of malignant neoplasm of breast: Secondary | ICD-10-CM | POA: Insufficient documentation

## 2015-08-24 DIAGNOSIS — Z8041 Family history of malignant neoplasm of ovary: Secondary | ICD-10-CM | POA: Diagnosis not present

## 2015-08-24 DIAGNOSIS — Z791 Long term (current) use of non-steroidal anti-inflammatories (NSAID): Secondary | ICD-10-CM | POA: Insufficient documentation

## 2015-08-24 DIAGNOSIS — N6091 Unspecified benign mammary dysplasia of right breast: Secondary | ICD-10-CM | POA: Insufficient documentation

## 2015-08-24 DIAGNOSIS — E7800 Pure hypercholesterolemia, unspecified: Secondary | ICD-10-CM | POA: Diagnosis not present

## 2015-08-24 DIAGNOSIS — C50912 Malignant neoplasm of unspecified site of left female breast: Secondary | ICD-10-CM | POA: Diagnosis present

## 2015-08-24 DIAGNOSIS — Z4001 Encounter for prophylactic removal of breast: Secondary | ICD-10-CM | POA: Insufficient documentation

## 2015-08-24 DIAGNOSIS — D0512 Intraductal carcinoma in situ of left breast: Secondary | ICD-10-CM | POA: Diagnosis not present

## 2015-08-24 DIAGNOSIS — Z79899 Other long term (current) drug therapy: Secondary | ICD-10-CM | POA: Insufficient documentation

## 2015-08-24 HISTORY — DX: Malignant (primary) neoplasm, unspecified: C80.1

## 2015-08-24 HISTORY — PX: MASTECTOMY W/ SENTINEL NODE BIOPSY: SHX2001

## 2015-08-24 SURGERY — MASTECTOMY WITH SENTINEL LYMPH NODE BIOPSY
Anesthesia: General | Site: Breast | Laterality: Bilateral

## 2015-08-24 MED ORDER — BUPIVACAINE-EPINEPHRINE (PF) 0.25% -1:200000 IJ SOLN
INTRAMUSCULAR | Status: DC | PRN
  Administered 2015-08-24: 60 mL

## 2015-08-24 MED ORDER — TECHNETIUM TC 99M SULFUR COLLOID FILTERED
1.0000 | Freq: Once | INTRAVENOUS | Status: AC | PRN
  Administered 2015-08-24: 1 via INTRADERMAL

## 2015-08-24 MED ORDER — MIDAZOLAM HCL 2 MG/2ML IJ SOLN
INTRAMUSCULAR | Status: AC
  Filled 2015-08-24: qty 2

## 2015-08-24 MED ORDER — MEPERIDINE HCL 25 MG/ML IJ SOLN: 6.2500 mg | INTRAMUSCULAR | Status: DC | PRN

## 2015-08-24 MED ORDER — GABAPENTIN 300 MG PO CAPS
ORAL_CAPSULE | ORAL | Status: AC
  Filled 2015-08-24: qty 1

## 2015-08-24 MED ORDER — CHLORHEXIDINE GLUCONATE CLOTH 2 % EX PADS: 6.0000 | MEDICATED_PAD | Freq: Once | CUTANEOUS | Status: DC

## 2015-08-24 MED ORDER — GABAPENTIN 300 MG PO CAPS
300.0000 mg | ORAL_CAPSULE | ORAL | Status: AC
  Administered 2015-08-24: 300 mg via ORAL

## 2015-08-24 MED ORDER — HYDROMORPHONE HCL 1 MG/ML IJ SOLN
INTRAMUSCULAR | Status: AC
  Filled 2015-08-24: qty 1

## 2015-08-24 MED ORDER — ONDANSETRON HCL 4 MG/2ML IJ SOLN: 4.0000 mg | Freq: Four times a day (QID) | INTRAMUSCULAR | Status: DC | PRN

## 2015-08-24 MED ORDER — MORPHINE SULFATE (PF) 2 MG/ML IV SOLN: 2.0000 mg | INTRAVENOUS | Status: DC | PRN

## 2015-08-24 MED ORDER — PROMETHAZINE HCL 25 MG/ML IJ SOLN: 6.2500 mg | INTRAMUSCULAR | Status: DC | PRN

## 2015-08-24 MED ORDER — CELECOXIB 200 MG PO CAPS
ORAL_CAPSULE | ORAL | Status: AC
  Filled 2015-08-24: qty 2

## 2015-08-24 MED ORDER — CEFAZOLIN SODIUM-DEXTROSE 2-4 GM/100ML-% IV SOLN
2.0000 g | INTRAVENOUS | Status: AC
  Administered 2015-08-24: 2 g via INTRAVENOUS

## 2015-08-24 MED ORDER — METHYLENE BLUE 0.5 % INJ SOLN
INTRAVENOUS | Status: AC
  Filled 2015-08-24: qty 10

## 2015-08-24 MED ORDER — CELECOXIB 400 MG PO CAPS
400.0000 mg | ORAL_CAPSULE | ORAL | Status: AC
  Administered 2015-08-24: 400 mg via ORAL

## 2015-08-24 MED ORDER — PROPOFOL 10 MG/ML IV BOLUS
INTRAVENOUS | Status: DC | PRN
  Administered 2015-08-24: 200 mg via INTRAVENOUS

## 2015-08-24 MED ORDER — ACETAMINOPHEN 500 MG PO TABS: 1000.0000 mg | ORAL_TABLET | Freq: Four times a day (QID) | ORAL | Status: AC

## 2015-08-24 MED ORDER — SIMETHICONE 80 MG PO CHEW: 40.0000 mg | CHEWABLE_TABLET | Freq: Four times a day (QID) | ORAL | Status: DC | PRN

## 2015-08-24 MED ORDER — SCOPOLAMINE 1 MG/3DAYS TD PT72: 1.0000 | MEDICATED_PATCH | Freq: Once | TRANSDERMAL | Status: DC | PRN

## 2015-08-24 MED ORDER — LACTATED RINGERS IV SOLN
INTRAVENOUS | Status: DC
  Administered 2015-08-24 (×2): via INTRAVENOUS

## 2015-08-24 MED ORDER — FENTANYL CITRATE (PF) 100 MCG/2ML IJ SOLN
INTRAMUSCULAR | Status: AC
  Filled 2015-08-24: qty 2

## 2015-08-24 MED ORDER — CEFAZOLIN SODIUM-DEXTROSE 2-4 GM/100ML-% IV SOLN
INTRAVENOUS | Status: AC
  Filled 2015-08-24: qty 100

## 2015-08-24 MED ORDER — DEXAMETHASONE SODIUM PHOSPHATE 4 MG/ML IJ SOLN
INTRAMUSCULAR | Status: DC | PRN
  Administered 2015-08-24: 10 mg via INTRAVENOUS

## 2015-08-24 MED ORDER — EPHEDRINE 5 MG/ML INJ
INTRAVENOUS | Status: AC
  Filled 2015-08-24: qty 10

## 2015-08-24 MED ORDER — OXYCODONE HCL 5 MG PO TABS
5.0000 mg | ORAL_TABLET | ORAL | Status: DC | PRN
  Administered 2015-08-24: 5 mg via ORAL
  Administered 2015-08-24: 10 mg via ORAL
  Administered 2015-08-24: 5 mg via ORAL
  Administered 2015-08-25 (×2): 10 mg via ORAL
  Filled 2015-08-24: qty 1
  Filled 2015-08-24: qty 2
  Filled 2015-08-24: qty 1
  Filled 2015-08-24 (×2): qty 2

## 2015-08-24 MED ORDER — FENTANYL CITRATE (PF) 100 MCG/2ML IJ SOLN
50.0000 ug | INTRAMUSCULAR | Status: AC | PRN
  Administered 2015-08-24: 25 ug via INTRAVENOUS
  Administered 2015-08-24: 50 ug via INTRAVENOUS
  Administered 2015-08-24: 25 ug via INTRAVENOUS
  Administered 2015-08-24: 50 ug via INTRAVENOUS
  Administered 2015-08-24: 25 ug via INTRAVENOUS
  Administered 2015-08-24: 50 ug via INTRAVENOUS

## 2015-08-24 MED ORDER — CELECOXIB 200 MG PO CAPS: 200.0000 mg | ORAL_CAPSULE | Freq: Two times a day (BID) | ORAL | Status: DC

## 2015-08-24 MED ORDER — KETOROLAC TROMETHAMINE 15 MG/ML IJ SOLN: 15.0000 mg | Freq: Four times a day (QID) | INTRAMUSCULAR | Status: DC | PRN

## 2015-08-24 MED ORDER — ACETAMINOPHEN 500 MG PO TABS
ORAL_TABLET | ORAL | Status: AC
  Filled 2015-08-24: qty 2

## 2015-08-24 MED ORDER — MIDAZOLAM HCL 2 MG/2ML IJ SOLN
1.0000 mg | INTRAMUSCULAR | Status: DC | PRN
  Administered 2015-08-24 (×2): 2 mg via INTRAVENOUS

## 2015-08-24 MED ORDER — DEXAMETHASONE SODIUM PHOSPHATE 10 MG/ML IJ SOLN
INTRAMUSCULAR | Status: AC
  Filled 2015-08-24: qty 1

## 2015-08-24 MED ORDER — GLYCOPYRROLATE 0.2 MG/ML IJ SOLN: 0.2000 mg | Freq: Once | INTRAMUSCULAR | Status: DC | PRN

## 2015-08-24 MED ORDER — LIDOCAINE HCL (CARDIAC) 20 MG/ML IV SOLN
INTRAVENOUS | Status: DC | PRN
Start: 2015-08-24 — End: 2015-08-24
  Administered 2015-08-24: 30 mg via INTRAVENOUS

## 2015-08-24 MED ORDER — SODIUM CHLORIDE 0.9 % IV SOLN
INTRAVENOUS | Status: DC
  Administered 2015-08-24: 14:00:00 via INTRAVENOUS

## 2015-08-24 MED ORDER — CEFAZOLIN SODIUM-DEXTROSE 2-4 GM/100ML-% IV SOLN: 2.0000 g | INTRAVENOUS | Status: DC

## 2015-08-24 MED ORDER — SODIUM CHLORIDE 0.9 % IJ SOLN
INTRAMUSCULAR | Status: AC
  Filled 2015-08-24: qty 10

## 2015-08-24 MED ORDER — METHOCARBAMOL 500 MG PO TABS: 500.0000 mg | ORAL_TABLET | Freq: Four times a day (QID) | ORAL | Status: DC | PRN

## 2015-08-24 MED ORDER — ACETAMINOPHEN 500 MG PO TABS
1000.0000 mg | ORAL_TABLET | ORAL | Status: AC
  Administered 2015-08-24: 1000 mg via ORAL

## 2015-08-24 MED ORDER — ONDANSETRON HCL 4 MG/2ML IJ SOLN
INTRAMUSCULAR | Status: AC
  Filled 2015-08-24: qty 2

## 2015-08-24 MED ORDER — SODIUM CHLORIDE 0.9 % IR SOLN
Status: DC | PRN
  Administered 2015-08-24: 1000 mL

## 2015-08-24 MED ORDER — ONDANSETRON 4 MG PO TBDP: 4.0000 mg | ORAL_TABLET | Freq: Four times a day (QID) | ORAL | Status: DC | PRN

## 2015-08-24 MED ORDER — LIDOCAINE 2% (20 MG/ML) 5 ML SYRINGE
INTRAMUSCULAR | Status: AC
  Filled 2015-08-24: qty 5

## 2015-08-24 MED ORDER — ONDANSETRON HCL 4 MG/2ML IJ SOLN
INTRAMUSCULAR | Status: DC | PRN
  Administered 2015-08-24: 4 mg via INTRAVENOUS

## 2015-08-24 MED ORDER — HYDROMORPHONE HCL 1 MG/ML IJ SOLN
0.2500 mg | INTRAMUSCULAR | Status: DC | PRN
  Administered 2015-08-24: 0.5 mg via INTRAVENOUS

## 2015-08-24 SURGICAL SUPPLY — 73 items
APPLIER CLIP 11 MED OPEN (CLIP) ×3
BENZOIN TINCTURE PRP APPL 2/3 (GAUZE/BANDAGES/DRESSINGS) IMPLANT
BINDER BREAST LRG (GAUZE/BANDAGES/DRESSINGS) IMPLANT
BINDER BREAST MEDIUM (GAUZE/BANDAGES/DRESSINGS) IMPLANT
BINDER BREAST XLRG (GAUZE/BANDAGES/DRESSINGS) IMPLANT
BINDER BREAST XXLRG (GAUZE/BANDAGES/DRESSINGS) IMPLANT
BIOPATCH RED 1 DISK 7.0 (GAUZE/BANDAGES/DRESSINGS) ×4 IMPLANT
BIOPATCH RED 1IN DISK 7.0MM (GAUZE/BANDAGES/DRESSINGS) ×2
BLADE CLIPPER SURG (BLADE) IMPLANT
BLADE HEX COATED 2.75 (ELECTRODE) ×3 IMPLANT
BLADE SURG 10 STRL SS (BLADE) ×3 IMPLANT
BLADE SURG 15 STRL LF DISP TIS (BLADE) ×1 IMPLANT
BLADE SURG 15 STRL SS (BLADE) ×2
CANISTER SUCT 1200ML W/VALVE (MISCELLANEOUS) ×3 IMPLANT
CHLORAPREP W/TINT 26ML (MISCELLANEOUS) ×3 IMPLANT
CLIP APPLIE 11 MED OPEN (CLIP) ×1 IMPLANT
CLIP TI WIDE RED SMALL 6 (CLIP) IMPLANT
CLOSURE WOUND 1/2 X4 (GAUZE/BANDAGES/DRESSINGS) ×1
COVER BACK TABLE 60X90IN (DRAPES) ×3 IMPLANT
COVER MAYO STAND STRL (DRAPES) ×3 IMPLANT
COVER PROBE W GEL 5X96 (DRAPES) ×3 IMPLANT
DECANTER SPIKE VIAL GLASS SM (MISCELLANEOUS) IMPLANT
DEVICE DISSECT PLASMABLAD 3.0S (MISCELLANEOUS) IMPLANT
DRAIN CHANNEL 19F RND (DRAIN) ×6 IMPLANT
DRAPE LAPAROSCOPIC ABDOMINAL (DRAPES) ×3 IMPLANT
DRAPE UTILITY XL STRL (DRAPES) ×3 IMPLANT
DRSG TEGADERM 4X4.75 (GAUZE/BANDAGES/DRESSINGS) IMPLANT
ELECT BLADE 4.0 EZ CLEAN MEGAD (MISCELLANEOUS) ×3
ELECT REM PT RETURN 9FT ADLT (ELECTROSURGICAL) ×3
ELECTRODE BLDE 4.0 EZ CLN MEGD (MISCELLANEOUS) ×1 IMPLANT
ELECTRODE REM PT RTRN 9FT ADLT (ELECTROSURGICAL) ×1 IMPLANT
EVACUATOR SILICONE 100CC (DRAIN) ×6 IMPLANT
GLOVE BIO SURGEON STRL SZ7 (GLOVE) ×6 IMPLANT
GLOVE BIOGEL PI IND STRL 7.0 (GLOVE) ×2 IMPLANT
GLOVE BIOGEL PI IND STRL 7.5 (GLOVE) ×1 IMPLANT
GLOVE BIOGEL PI INDICATOR 7.0 (GLOVE) ×4
GLOVE BIOGEL PI INDICATOR 7.5 (GLOVE) ×2
GLOVE ECLIPSE 6.5 STRL STRAW (GLOVE) ×3 IMPLANT
GOWN STRL REUS W/ TWL LRG LVL3 (GOWN DISPOSABLE) ×3 IMPLANT
GOWN STRL REUS W/TWL LRG LVL3 (GOWN DISPOSABLE) ×6
HEMOSTAT ARISTA ABSORB 3G PWDR (MISCELLANEOUS) ×6 IMPLANT
ILLUMINATOR WAVEGUIDE N/F (MISCELLANEOUS) IMPLANT
LIGHT WAVEGUIDE WIDE FLAT (MISCELLANEOUS) IMPLANT
LIQUID BAND (GAUZE/BANDAGES/DRESSINGS) ×6 IMPLANT
NDL SAFETY ECLIPSE 18X1.5 (NEEDLE) IMPLANT
NEEDLE HYPO 18GX1.5 SHARP (NEEDLE)
NEEDLE HYPO 25X1 1.5 SAFETY (NEEDLE) ×3 IMPLANT
NS IRRIG 1000ML POUR BTL (IV SOLUTION) ×3 IMPLANT
PACK BASIN DAY SURGERY FS (CUSTOM PROCEDURE TRAY) ×3 IMPLANT
PENCIL BUTTON HOLSTER BLD 10FT (ELECTRODE) ×3 IMPLANT
PIN SAFETY STERILE (MISCELLANEOUS) ×3 IMPLANT
PLASMABLADE 3.0S (MISCELLANEOUS)
SHEET MEDIUM DRAPE 40X70 STRL (DRAPES) IMPLANT
SLEEVE SCD COMPRESS KNEE MED (MISCELLANEOUS) ×3 IMPLANT
SPONGE GAUZE 4X4 12PLY STER LF (GAUZE/BANDAGES/DRESSINGS) IMPLANT
SPONGE LAP 18X18 X RAY DECT (DISPOSABLE) ×6 IMPLANT
SPONGE LAP 4X18 X RAY DECT (DISPOSABLE) IMPLANT
STOCKINETTE IMPERVIOUS LG (DRAPES) IMPLANT
STRIP CLOSURE SKIN 1/2X4 (GAUZE/BANDAGES/DRESSINGS) ×2 IMPLANT
SUT ETHILON 2 0 FS 18 (SUTURE) ×9 IMPLANT
SUT MNCRL AB 4-0 PS2 18 (SUTURE) ×6 IMPLANT
SUT SILK 2 0 SH (SUTURE) ×3 IMPLANT
SUT VIC AB 3-0 54X BRD REEL (SUTURE) IMPLANT
SUT VIC AB 3-0 BRD 54 (SUTURE)
SUT VIC AB 3-0 SH 27 (SUTURE)
SUT VIC AB 3-0 SH 27X BRD (SUTURE) IMPLANT
SUT VICRYL 3-0 CR8 SH (SUTURE) ×3 IMPLANT
SYR CONTROL 10ML LL (SYRINGE) ×3 IMPLANT
TOWEL OR 17X24 6PK STRL BLUE (TOWEL DISPOSABLE) ×3 IMPLANT
TOWEL OR NON WOVEN STRL DISP B (DISPOSABLE) ×3 IMPLANT
TUBE CONNECTING 20'X1/4 (TUBING) ×1
TUBE CONNECTING 20X1/4 (TUBING) ×2 IMPLANT
YANKAUER SUCT BULB TIP NO VENT (SUCTIONS) ×3 IMPLANT

## 2015-08-24 NOTE — H&P (Signed)
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 she has done well and underwent screening mm recnelty that showed a left breast lower outer quadrant architectural distortion. this is in area of prior excision. density is C. [place] at this area shows a 8x6x6 mm mass. stereo biopsy was done with clip placement that shows grade I idc with dcis, er/pr pos, her 2 negative, Ki is 15%. she is seen in consult from Dr Ammie Ferrier. she has family history of breast cancer in 3 paternal aunts and an aunt with ovarian cancer as well. she has no mass or discharge on her own exam   Other Problems  Breast Cancer Cholelithiasis Hypercholesterolemia Lump In Breast  Past Surgical History  Breast Biopsy Left. Gallbladder Surgery - Laparoscopic  Diagnostic Studies History  Colonoscopy never Mammogram within last year Pap Smear 1-5 years ago  Allergies  No Known Drug Allergies  Medication History  Simvastatin (20MG  Tablet, Oral) Active. Multivitamin Adult (Oral) Active. Baby Aspirin (81MG  Tablet Chewable, Oral) Active. Fish Oil (1000MG  Capsule, Oral) Active. Medications Reconciled  Social History ( Alcohol use Occasional alcohol use. Caffeine use Carbonated beverages, Coffee, Tea. No drug use Tobacco use Never smoker.  Family History  Arthritis Mother, Sister. Diabetes Mellitus Brother, Father, Mother, Sister. Heart Disease Brother, Mother. Heart disease in female family member before age 70 Hypertension Brother, Mother, Sister. Prostate Cancer Father.  Pregnancy / Birth History Age at menarche 50 years. Contraceptive History Oral contraceptives. Gravida 0 Irregular periods Para 0  Review of Systems General Not Present- Appetite Loss, Chills, Fatigue, Fever, Night Sweats, Weight Gain and Weight Loss. Skin Not Present- Change in Wart/Mole, Dryness, Hives, Jaundice,  New Lesions, Non-Healing Wounds, Rash and Ulcer. HEENT Present- Seasonal Allergies. Not Present- Earache, Hearing Loss, Hoarseness, Nose Bleed, Oral Ulcers, Ringing in the Ears, Sinus Pain, Sore Throat, Visual Disturbances, Wears glasses/contact lenses and Yellow Eyes. Breast Present- Breast Mass. Not Present- Breast Pain, Nipple Discharge and Skin Changes. Cardiovascular Not Present- Chest Pain, Difficulty Breathing Lying Down, Leg Cramps, Palpitations, Rapid Heart Rate, Shortness of Breath and Swelling of Extremities. Gastrointestinal Not Present- Abdominal Pain, Bloating, Bloody Stool, Change in Bowel Habits, Chronic diarrhea, Constipation, Difficulty Swallowing, Excessive gas, Gets full quickly at meals, Hemorrhoids, Indigestion, Nausea, Rectal Pain and Vomiting. Female Genitourinary Not Present- Frequency, Nocturia, Painful Urination, Pelvic Pain and Urgency. Musculoskeletal Not Present- Back Pain, Joint Pain, Joint Stiffness, Muscle Pain, Muscle Weakness and Swelling of Extremities. Neurological Not Present- Decreased Memory, Fainting, Headaches, Numbness, Seizures, Tingling, Tremor, Trouble walking and Weakness. Psychiatric Not Present- Anxiety, Bipolar, Change in Sleep Pattern, Depression, Fearful and Frequent crying. Endocrine Not Present- Cold Intolerance, Excessive Hunger, Hair Changes, Heat Intolerance, Hot flashes and New Diabetes. Hematology Not Present- Blood Thinners, Easy Bruising, Excessive bleeding, Gland problems, HIV and Persistent Infections.  Vitals  Weight: 168 lb Height: 63in Body Surface Area: 1.8 m Body Mass Index: 29.76 kg/m  Pulse: 90 (Regular)  BP: 130/82 (Sitting, Left Arm, Standard)  Physical Exam General Mental Status-Alert. Orientation-Oriented X3. Eye Sclera/Conjunctiva - Bilateral-No scleral icterus. Chest and Lung Exam Chest and lung exam reveals -quiet, even and easy respiratory effort with no use of accessory muscles and on  auscultation, normal breath sounds, no adventitious sounds and normal vocal resonance. Breast Nipples-No Discharge. Breast Lump-No Palpable Breast Mass. Cardiovascular Cardiovascular examination reveals -normal heart sounds, regular rate and rhythm with no murmurs. Lymphatic Head & Neck General Head &  Neck Lymphatics: Bilateral - Description - Normal. Axillary General Axillary Region: Bilateral - Description - Normal. Note: no Dickson adenopathy   Assessment & Plan  BREAST CANCER OF LOWER-OUTER QUADRANT OF LEFT FEMALE BREAST (C50.512) Bilateral mastectomies with left ax sn biopsy

## 2015-08-24 NOTE — Anesthesia Procedure Notes (Signed)
Anesthesia Regional Block:  Pectoralis block  Pre-Anesthetic Checklist: ,, timeout performed, Correct Patient, Correct Site, Correct Laterality, Correct Procedure, Correct Position, site marked, Risks and benefits discussed, Surgical consent,  Pre-op evaluation,  Post-op pain management  Laterality: Left and Right  Prep: chloraprep       Needles:   Needle Type: Stimiplex     Needle Length: 9cm 9 cm     Additional Needles:  Procedures: ultrasound guided (picture in chart) Pectoralis block Narrative:  Start time: 08/24/2015 9:58 AM End time: 08/24/2015 10:18 AM Injection made incrementally with aspirations every 5 mL.  Performed by: Personally  Anesthesiologist: Nolon Nations  Additional Notes: Patient tolerated well. Good fascial spread noted.

## 2015-08-24 NOTE — Progress Notes (Signed)
Assisted Dr. Lissa Hoard with right and left, ultrasound guided, pectoralis block. Side rails up, monitors on throughout procedure. See vital signs in flow sheet. Tolerated Procedure well.

## 2015-08-24 NOTE — Anesthesia Postprocedure Evaluation (Signed)
Anesthesia Post Note  Patient: Patricia Larson  Procedure(s) Performed: Procedure(s) (LRB): BILATERAL TOTAL MASTECTOMY WITH LEFT SENTINEL LYMPH NODE BIOPSY (Bilateral)  Patient location during evaluation: PACU Anesthesia Type: General and Regional Level of consciousness: sedated and patient cooperative Pain management: pain level controlled Vital Signs Assessment: post-procedure vital signs reviewed and stable Respiratory status: spontaneous breathing Cardiovascular status: stable Anesthetic complications: no    Last Vitals:  Vitals:   08/24/15 1400 08/24/15 1426  BP: 129/83 131/80  Pulse: 86 82  Resp: 12 16  Temp:  36.4 C    Last Pain:  Vitals:   08/24/15 1426  TempSrc:   PainSc: [place]

## 2015-08-24 NOTE — Transfer of Care (Signed)
Immediate Anesthesia Transfer of Care Note  Patient: Patricia Larson  Procedure(s) Performed: Procedure(s): BILATERAL TOTAL MASTECTOMY WITH LEFT SENTINEL LYMPH NODE BIOPSY (Bilateral)  Patient Location: PACU  Anesthesia Type:GA combined with regional for post-op pain  Level of Consciousness: awake and patient cooperative  Airway & Oxygen Therapy: Patient Spontanous Breathing and Patient connected to face mask oxygen  Post-op Assessment: Report given to RN and Post -op Vital signs reviewed and stable  Post vital signs: Reviewed and stable  Last Vitals:  Vitals:   08/24/15 1025 08/24/15 1030  BP: (!) 145/72 (!) 143/72  Pulse: 91 86  Resp: (!) 23 16  Temp:      Last Pain:  Vitals:   08/24/15 0928  TempSrc: Oral         Complications: No apparent anesthesia complications

## 2015-08-24 NOTE — Anesthesia Procedure Notes (Signed)
Procedure Name: LMA Insertion Date/Time: 08/24/2015 10:47 AM Performed by: Edeline Greening D Pre-anesthesia Checklist: Patient identified, Emergency Drugs available, Suction available and Patient being monitored Patient Re-evaluated:Patient Re-evaluated prior to inductionOxygen Delivery Method: Circle system utilized Preoxygenation: Pre-oxygenation with 100% oxygen Intubation Type: IV induction Ventilation: Mask ventilation without difficulty LMA: LMA inserted LMA Size: 4.0 Number of attempts: 1 Airway Equipment and Method: Bite block Placement Confirmation: positive ETCO2 Tube secured with: Tape Dental Injury: Teeth and Oropharynx as per pre-operative assessment

## 2015-08-24 NOTE — Anesthesia Preprocedure Evaluation (Signed)
Anesthesia Evaluation  Patient identified by MRN, date of birth, ID band Patient awake    Reviewed: Allergy & Precautions, NPO status , Patient's Chart, lab work & pertinent test results  Airway Mallampati: II  TM Distance: >3 FB Neck ROM: Full    Dental no notable dental hx.    Pulmonary neg pulmonary ROS,    Pulmonary exam normal breath sounds clear to auscultation       Cardiovascular negative cardio ROS Normal cardiovascular exam Rhythm:Regular Rate:Normal     Neuro/Psych negative neurological ROS  negative psych ROS   GI/Hepatic negative GI ROS, Neg liver ROS,   Endo/Other  negative endocrine ROS  Renal/GU negative Renal ROS     Musculoskeletal negative musculoskeletal ROS (+)   Abdominal   Peds  Hematology negative hematology ROS (+)   Anesthesia Other Findings   Reproductive/Obstetrics negative OB ROS                             Anesthesia Physical Anesthesia Plan  ASA: II  Anesthesia Plan: General   Post-op Pain Management: GA combined w/ Regional for post-op pain   Induction: Intravenous  Airway Management Planned: LMA  Additional Equipment:   Intra-op Plan:   Post-operative Plan: Extubation in OR  Informed Consent: I have reviewed the patients History and Physical, chart, labs and discussed the procedure including the risks, benefits and alternatives for the proposed anesthesia with the patient or authorized representative who has indicated his/her understanding and acceptance.   Dental advisory given  Plan Discussed with: CRNA  Anesthesia Plan Comments:         Anesthesia Quick Evaluation

## 2015-08-24 NOTE — Progress Notes (Signed)
Nuc med staff performed injection. Pt tol well and VSS (see flowsheet). No additional sedation required. Husband called to bedside, updated, emotional support provided.

## 2015-08-24 NOTE — Op Note (Signed)
Preoperative diagnosis: Clinical stage I left breast cancer Postoperative diagnosis: Same as above Procedure: #1 right prophylactic total mastectomy #2 left total mastectomy #3 left axillary sentinel lymph node biopsy Surgeon: Dr. Serita Grammes Anesthesia: Gen. 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 She had a mammographic abnormality and ends up having a clinical stage I left breast cancer. We discussed all of her options but she was adamant that she undergo a double mastectomy. We had multiple conversations about this. She elected not to have reconstruction.We discussed the risks and benefits of the procedure as well as recovery.  Procedure: After informed consent was obtained the patient was taken to the operating room. She was injected with technetium in the standard periareolar fashion. She underwent bilateral pectoral blocks as well. She was then placed under general anesthesia without complication. Her chest was then prepped and draped in the standard sterile surgical fashion. A surgical timeout was then performed.  I first did the right side. I made an elliptical incision that removed the skin as well as encompass the nipple areolar complex. I created flaps to the clavicle, parasternal region, inframammary fold as well as the latissimus laterally. The breast including the pectoralis fascia was then removed. This was marked as above and passed off the table. I inserted a 51 [place] Blake drain and secured this with 3-0 nylon suture. Hemostasis was observed. I placed Arista  in the cavity. I then closed with 3-0 Vicryl and 4-0 Monocryl. Steri-Strips and glue were placed. A Biopatch and a dressing were placed over the drain.  I then performed a left mastectomy in a similar fashion. I made an elliptical incision to encompass the nipple areolar complex. I created flaps in a similar fashion. This was a little bit more difficult due to the scarring from her prior surgery on this side. I then removed the breast tissue including the pectoralis fascia and marked this in the same fashion. I then entered into the axilla. The counts were not very high. I removed a bundle of tissue which appeared toinclude a node that was the only location I could get any counts. There was no background radioactivity. I then obtained hemostasis. I inserted a 2 [place] Blake drain and secured this. I placed Arista in this cavity. I close this in a similar fashion. A breast binder was placed at completion. She tolerated this well was extubated and transferred to recovery in stable condition.

## 2015-08-24 NOTE — Interval H&P Note (Signed)
History and Physical Interval Note:  08/24/2015 10:26 AM  Patricia Larson  has presented today for surgery, with the diagnosis of LEFT BREAST CANCER  The various methods of treatment have been discussed with the patient and family. After consideration of risks, benefits and other options for treatment, the patient has consented to  Procedure(s): BILATERAL TOTAL MASTECTOMY WITH LEFT SENTINEL LYMPH NODE BIOPSY (Bilateral) as a surgical intervention .  The patient's history has been reviewed, patient examined, no change in status, stable for surgery.  I have reviewed the patient's chart and labs.  Questions were answered to the patient's satisfaction.     Cheyrl Buley

## 2015-08-25 ENCOUNTER — Encounter (HOSPITAL_BASED_OUTPATIENT_CLINIC_OR_DEPARTMENT_OTHER): Payer: Self-pay | Admitting: General Surgery

## 2015-08-25 DIAGNOSIS — C50512 Malignant neoplasm of lower-outer quadrant of left female breast: Secondary | ICD-10-CM | POA: Diagnosis not present

## 2015-08-25 MED ORDER — OXYCODONE HCL 5 MG PO TABS: 5.0000 mg | ORAL_TABLET | ORAL | 0 refills | Status: DC | PRN

## 2015-08-25 NOTE — Discharge Instructions (Signed)
[facility] [place] °[phone] ° °MASTECTOMY: POST OP INSTRUCTIONS ° °Always review your discharge instruction sheet given to you by the facility where your surgery was performed. °IF YOU HAVE DISABILITY OR FAMILY LEAVE FORMS, YOU MUST BRING THEM TO THE OFFICE FOR PROCESSING.   °DO NOT GIVE THEM TO YOUR DOCTOR. °A prescription for pain medication may be given to you upon discharge.  Take your pain medication as prescribed, if needed.  If narcotic pain medicine is not needed, then you may take acetaminophen (Tylenol), naprosyn (Alleve) or ibuprofen (Advil) as needed. °1. Take your usually prescribed medications unless otherwise directed. °2. If you need a refill on your pain medication, please contact your pharmacy.  They will contact our office to request authorization.  Prescriptions will not be filled after 5pm or on week-ends. °3. You should follow a light diet the first few days after arrival home, such as soup and crackers, etc.  Resume your normal diet the day after surgery. °4. Most patients will experience some swelling and bruising on the chest and underarm.  Ice packs will help.  Swelling and bruising can take several days to resolve. Wear the binder day and night until you return to the office.  °5. It is common to experience some constipation if taking pain medication after surgery.  Increasing fluid intake and taking a stool softener (such as Colace) will usually help or prevent this problem from occurring.  A mild laxative (Milk of Magnesia or Miralax) should be taken according to package instructions if there are no bowel movements after 48 hours. °6. Unless discharge instructions indicate otherwise, leave your bandage dry and in place until your next appointment in 3-5 days.  You may take a limited sponge bath.  No tube baths or showers until the drains are removed.  You may have steri-strips (small skin tapes) in place directly over the incision.  These strips should be left on the  skin for 7-10 days. If you have glue it will come off in next couple week.  Any sutures will be removed at an office visit °7. DRAINS:  If you have drains in place, it is important to keep a list of the amount of drainage produced each day in your drains.  Before leaving the hospital, you should be instructed on drain care.  Call our office if you have any questions about your drains. I will remove your drains when they put out less than 30 cc or ml for 2 consecutive days. °8. ACTIVITIES:  You may resume regular (light) daily activities beginning the next day--such as daily self-care, walking, climbing stairs--gradually increasing activities as tolerated.  You may have sexual intercourse when it is comfortable.  Refrain from any heavy lifting or straining until approved by your doctor. °a. You may drive when you are no longer taking prescription pain medication, you can comfortably wear a seatbelt, and you can safely maneuver your car and apply brakes. °b. RETURN TO WORK:  __________________________________________________________ °9. You should see your doctor in the office for a follow-up appointment approximately 3-5 days after your surgery.  Your doctor’s nurse will typically make your follow-up appointment when she calls you with your pathology report.  Expect your pathology report 3-4business days after surgery. °10. OTHER INSTRUCTIONS: ______________________________________________________________________________________________ ____________________________________________________________________________________________ °WHEN TO CALL YOUR DR WAKEFIELD: °1. Fever over 101.0 °2. Nausea and/or vomiting °3. Extreme swelling or bruising °4. Continued bleeding from incision. °5. Increased pain, redness, or drainage from the incision. °The clinic staff is available   to answer your questions during regular business hours.  Please dont hesitate to call and ask to speak to one of the nurses for clinical concerns.  If  you have a medical emergency, go to the nearest emergency room or call 911.  A surgeon from [facility] is always on call at the hospital. [address] [place], [place], [place]  [address] ? [address], [place], [place]   [address] [phone] ? [phone] ? FAX [phone] Web site: [email or web address]   Post Anesthesia Home Care Instructions  Activity: Get plenty of rest for the remainder of the day. A responsible adult should stay with you for 24 hours following the procedure.  For the next 24 hours, DO NOT: -Drive a car -Paediatric nurse -Drink alcoholic beverages -Take any medication unless instructed by your physician -Make any legal decisions or sign important papers.  Meals: Start with liquid foods such as gelatin or soup. Progress to regular foods as tolerated. Avoid greasy, spicy, heavy foods. If nausea and/or vomiting occur, drink only clear liquids until the nausea and/or vomiting subsides. Call your physician if vomiting continues.  Special Instructions/Symptoms: Your throat may feel dry or sore from the anesthesia or the breathing tube placed in your throat during surgery. If this causes discomfort, gargle with warm salt water. The discomfort should disappear within 24 hours.  If you had a scopolamine patch placed behind your ear for the management of post- operative nausea and/or vomiting:  1. The medication in the patch is effective for 72 hours, after which it should be removed.  Wrap patch in a tissue and discard in the trash. Wash hands thoroughly with soap and water. 2. You may remove the patch earlier than 72 hours if you experience unpleasant side effects which may include dry mouth, dizziness or visual disturbances. 3. Avoid touching the patch. Wash your hands with soap and water after contact with the patch.

## 2015-08-30 ENCOUNTER — Telehealth: Payer: Self-pay | Admitting: *Deleted

## 2015-08-30 NOTE — Telephone Encounter (Signed)
Ordered oncotype per Dr. Burr Medico.  Faxed requisition to pathology and confirmed receipt with Tammy.

## 2015-09-09 ENCOUNTER — Encounter (HOSPITAL_COMMUNITY): Payer: Self-pay

## 2015-09-12 ENCOUNTER — Telehealth: Payer: Self-pay | Admitting: *Deleted

## 2015-09-12 NOTE — Telephone Encounter (Signed)
Spoke with patient and informed her of the oncotype results of 8 and no chemo needed.  She will see Dr. Burr Medico on 9/18.

## 2015-09-19 ENCOUNTER — Telehealth: Payer: Self-pay | Admitting: Hematology

## 2015-09-19 ENCOUNTER — Ambulatory Visit (HOSPITAL_BASED_OUTPATIENT_CLINIC_OR_DEPARTMENT_OTHER): Payer: Managed Care, Other (non HMO)

## 2015-09-19 ENCOUNTER — Ambulatory Visit (HOSPITAL_BASED_OUTPATIENT_CLINIC_OR_DEPARTMENT_OTHER): Payer: Managed Care, Other (non HMO) | Admitting: Hematology

## 2015-09-19 VITALS — BP 130/68 | HR 73 | Temp 98.1°F | Resp 18 | Ht 63.0 in | Wt 170.2 lb

## 2015-09-19 DIAGNOSIS — C50512 Malignant neoplasm of lower-outer quadrant of left female breast: Secondary | ICD-10-CM | POA: Diagnosis not present

## 2015-09-19 DIAGNOSIS — Z170 Estrogen receptor positive status [ER+]: Secondary | ICD-10-CM | POA: Diagnosis not present

## 2015-09-19 LAB — CBC WITH DIFFERENTIAL/PLATELET
BASO%: 0.4 % (ref 0.0–2.0)
BASOS ABS: 0 10*3/uL (ref 0.0–0.1)
EOS%: 4.5 % (ref 0.0–7.0)
Eosinophils Absolute: 0.3 10*3/uL (ref 0.0–0.5)
HCT: 37.3 % (ref 34.8–46.6)
HGB: 12.6 g/dL (ref 11.6–15.9)
LYMPH%: 29.9 % (ref 14.0–49.7)
MCH: 29.9 pg (ref 25.1–34.0)
MCHC: 33.8 g/dL (ref 31.5–36.0)
MCV: 88.4 fL (ref 79.5–101.0)
MONO#: 0.6 10*3/uL (ref 0.1–0.9)
MONO%: 8.8 % (ref 0.0–14.0)
NEUT#: 4 10*3/uL (ref 1.5–6.5)
NEUT%: 56.4 % (ref 38.4–76.8)
Platelets: 262 10*3/uL (ref 145–400)
RBC: 4.22 10*6/uL (ref 3.70–5.45)
RDW: 13.1 % (ref 11.2–14.5)
WBC: 7.1 10*3/uL (ref 3.9–10.3)
lymph#: 2.1 10*3/uL (ref 0.9–3.3)
nRBC: 0 % (ref 0–0)

## 2015-09-19 LAB — COMPREHENSIVE METABOLIC PANEL
ALK PHOS: 111 U/L (ref 40–150)
ALT: 19 U/L (ref 0–55)
AST: 19 U/L (ref 5–34)
Albumin: 3.8 g/dL (ref 3.5–5.0)
Anion Gap: 10 mEq/L (ref 3–11)
BUN: 13.6 mg/dL (ref 7.0–26.0)
CHLORIDE: 103 meq/L (ref 98–109)
CO2: 27 meq/L (ref 22–29)
Calcium: 9.5 mg/dL (ref 8.4–10.4)
Creatinine: 0.7 mg/dL (ref 0.6–1.1)
GLUCOSE: 87 mg/dL (ref 70–140)
POTASSIUM: 4.1 meq/L (ref 3.5–5.1)
SODIUM: 140 meq/L (ref 136–145)
Total Bilirubin: 0.4 mg/dL (ref 0.20–1.20)
Total Protein: 7.2 g/dL (ref 6.4–8.3)

## 2015-09-19 MED ORDER — TAMOXIFEN CITRATE 20 MG PO TABS: 20.0000 mg | ORAL_TABLET | Freq: Every day | ORAL | 2 refills | Status: DC

## 2015-09-19 NOTE — Telephone Encounter (Signed)
Avs report and appointment schedule given to patient, per 09/19/15 los. °

## 2015-09-19 NOTE — Progress Notes (Signed)
[place]  [phone] Fax:([phone]) [phone]  Clinic Follow Up Note   Patient Care Team: Vanessa Kick, MD as PCP - General (Obstetrics and Gynecology) Rolm Bookbinder, MD as Consulting Physician (General Surgery) 09/19/2015   CHIEF COMPLAINTS:  Follow up left breast cancer  Oncology History   Breast cancer of lower-outer quadrant of left female breast [facility])   Staging form: Breast, AJCC 7th Edition   - Clinical stage from 07/04/2015: Stage IA (T1b, N0, M0) - Signed by Truitt Merle, MD on 07/19/2015   - Pathologic stage from 08/24/2015: Stage IA (T1c, N0, cM0) - Signed by Truitt Merle, MD on 09/19/2015      Breast cancer of 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      07/15/2015 Imaging    breast MRI w wo contrast showed biopsy proven malignancy in the lower outer left breast measures 1 x 1 x 0.7 cm. No findings to suggest multifocal or multicentric disease. Right breast was negative.      08/24/2015 Surgery    Bilateral breast mastectomy and sentinel lymph node biopsy      08/24/2015 Pathology Results    Left breast mastectomy showed a 1.3 cm invasive ductal carcinoma, grade 1, low-grade DCIS, LVI (+), surgical margins were negative. 5 left axillary nodes were negative. Right breast mastectomy showed no malignancy, 7 lymph nodes negative.       08/24/2015 Oncotype testing    Recurrence score 8, which  predicts 10 year risk of distant recurrence 6% with tamoxifen        HISTORY OF PRESENTING ILLNESS:  Patricia Larson 50 y.o. female is here because of Her recently diagnosed breast cancer. She is accompanied by her husband to my clinic today. She was referred by her breast surgeon Dr. Donne Hazel.   She had a begin left lesion (pallioma) in 2012, which was removed by Dr. Donne Hazel. She has been doing well since then, had a screening mammogram every year. On the recent screening mammogram in early July, asymmetrically architecture distortion in the left outer quadrant of the left breast was found. She underwent diagnostic mammogram and ultrasound, which showed a 0.6 cm in mass in the prior surgical site, 3 cm from the nipple. Axillary was negative for lymphadenopathy. She underwent core needle biopsy which showed invasive ductal carcinoma, ER/PR strongly positive, HER-2 negative, Ki-67 15%.  She feels well, no symptoms. She has mild pain in right elbow in the past few weeks, no other pain symptoms. She has good appetite and energy level, weight stable.   GYN HISTORY  Menarchal: 11 LMP: 07/08/2015 Contraceptive: 30 stopped in 2012  HRT: n/a  G0P0:  CURRENT THERAPY: Pending adjuvant therapy   INTERIM HISTORY:  Patricia Larson returns for follow up. She underwent a bilateral mastectomy and sentinel lymph node biopsy on August 23 and 17. She tolerated surgery well, and  had been recovering well also. Her G-tube has been removed, pain at the incision sites are minimum now, she overall feels well.    MEDICAL HISTORY:  Past Medical History:  Diagnosis Date  . Cancer [facility]) 2017   left breast cancer  . Hyperlipidemia     SURGICAL HISTORY: Past Surgical History:  Procedure Laterality Date  . BREAST BIOPSY  06/28/10   left  . CHOLECYSTECTOMY    . MASTECTOMY W/ SENTINEL NODE BIOPSY Bilateral 08/24/2015   Procedure: BILATERAL TOTAL MASTECTOMY WITH LEFT SENTINEL LYMPH NODE BIOPSY;  Surgeon: Rolm Bookbinder,  MD;  Location: [facility];  Service: General;  Laterality: Bilateral;    SOCIAL HISTORY: Social History   Social History  . Marital status: Married    Spouse name: N/A  . Number of children: N/A  . Years of education: N/A   Occupational History  . Not on file.   Social History Main Topics  . Smoking status: Never Smoker  . Smokeless tobacco: Never Used  . Alcohol use 1.8 oz/week    3 Standard drinks or equivalent per week     Comment: 1-2 a week; sometime only 1 per month - generally wine or occ beer  . Drug use: No  . Sexual activity: Not on file   Other Topics Concern  . Not on file   Social History Narrative  . No narrative on file    FAMILY HISTORY: Family History  Problem Relation Age of Onset  . Prostate cancer Father 86    treated with surgery and hormonal pills; metastasis to bones at 66y  . Heart failure Mother   . Hypertension Mother   . Other Mother     hx of hysterectomy at age 6  . Hypertension Sister   . Other Sister     one sister had hysterectomy at 108 and BSO at 7 due to abnormal bleeding  . Ovarian cancer Maternal Aunt     dx late 60s-early 70s  . Breast cancer Paternal Aunt     dx. 32s  . Prostate cancer Paternal Uncle     dx. 70s  . Breast cancer Paternal Aunt     dx. 31-70  . Breast cancer Cousin     (x3) paternal 1st cousins dx. breast cancer in their 66s (all share the same parent)  . Lung cancer Paternal Uncle     d. 6s  . Heart Problems Brother     d. 93  . Cancer Cousin 62    maternal 1st cousin dx. NOS cancer  . Breast cancer Paternal Aunt     dx. 34s  . Prostate cancer Paternal Uncle     d. 71s  . Ovarian cancer Other     paternal great aunt (PGF's sister) d. gyn cancer - likely ovarian in her 28s-70  . Breast cancer Cousin 58    paternal 1st cousin, once-removed; (PGF's brother's daughter)    ALLERGIES:  has no active allergies.  MEDICATIONS:  Current Outpatient Prescriptions  Medication Sig  Dispense Refill  . celecoxib (CELEBREX) 200 MG capsule Take 200 mg by mouth 2 (two) times daily.    . Cholecalciferol (VITAMIN D3) 1000 units CAPS Take by mouth.    . Multiple Vitamin (MULTIVITAMIN PO) Take by mouth daily.      . Omega-3 Fatty Acids (FISH OIL) 1200 MG CAPS Take 1 capsule by mouth daily.    Marland Kitchen oxyCODONE (OXY IR/ROXICODONE) 5 MG immediate release tablet Take 1-2 tablets (5-10 mg  total) by mouth every 4 (four) hours as needed for moderate pain. 30 tablet 0  . simvastatin (ZOCOR) 20 MG tablet Take 20 mg by mouth daily.     No current facility-administered medications for this visit.      REVIEW OF SYSTEMS:   Constitutional: Denies fevers, chills or abnormal night sweats Eyes: Denies blurriness of vision, double vision or watery eyes Ears, nose, mouth, throat, and face: Denies mucositis or sore throat Respiratory: Denies cough, dyspnea or wheezes Cardiovascular: Denies palpitation, chest discomfort or lower extremity swelling Gastrointestinal:  Denies nausea, heartburn or change in bowel habits Skin: Denies abnormal skin rashes Lymphatics: Denies new lymphadenopathy or easy bruising Neurological:Denies numbness, tingling or new weaknesses Behavioral/Psych: Mood is stable, no new changes  All other systems were reviewed with the patient and are negative.  PHYSICAL EXAMINATION: ECOG PERFORMANCE STATUS: 0 - Asymptomatic  Vitals:   09/19/15 1441  BP: 130/68  Pulse: 73  Resp: 18  Temp: 98.1 F (36.7 C)   Filed Weights   09/19/15 1441  Weight: 170 lb 3.2 oz (77.2 kg)    GENERAL:alert, no distress and comfortable SKIN: skin color, texture, turgor are normal, no rashes or significant lesions EYES: normal, conjunctiva are pink and non-injected, sclera clear OROPHARYNX:no exudate, no erythema and lips, buccal mucosa, and tongue normal  NECK: supple, thyroid normal size, non-tender, without nodularity LYMPH:  no palpable lymphadenopathy in the cervical, axillary or  inguinal LUNGS: clear to auscultation and percussion with normal breathing effort HEART: regular rate & rhythm and no murmurs and no lower extremity edema ABDOMEN:abdomen soft, non-tender and normal bowel sounds Musculoskeletal:no cyanosis of digits and no clubbing  PSYCH: alert & oriented x 3 with fluent speech NEURO: no focal motor/sensory deficits Breasts: s/p b/l mastectomy. Surgical incisions has healed well, there is a small wound dishiscence in the right incision, with mild surrounding skin erythema, no discharge.   LABORATORY DATA:  I have reviewed the data as listed CBC Latest Ref Rng & Units 06/28/2010 06/23/2010  WBC 4.0 - 10.5 K/uL - 7.1  Hemoglobin 12.0 - 15.0 g/dL 14.1 12.7  Hematocrit 36.0 - 46.0 % - 37.0  Platelets 150 - 400 K/uL - 242   CMP Latest Ref Rng & Units 06/23/2010  Glucose 70 - 99 mg/dL 94  BUN 6 - 23 mg/dL 13  Creatinine 0.50 - 1.10 mg/dL 0.56  Sodium 135 - 145 mEq/L 138  Potassium 3.5 - 5.1 mEq/L 4.0  Chloride 96 - 112 mEq/L 100  CO2 19 - 32 mEq/L 30  Calcium 8.4 - 10.5 mg/dL 9.7   PATHOLOGY REPORTS: Diagnosis 07/04/2015 Breast, left, needle core biopsy, lower outer quadrant - INVASIVE DUCTAL CARCINOMA. - DUCTAL CARCINOMA IN SITU. - SEE COMMENT. Microscopic Comment The carcinoma appears grade 1. A breast prognostic profile will be performed and the results reported separately. The results were called to the [facility] on 07/06/2015. (JBK:kh 07/06/15) Results: IMMUNOHISTOCHEMICAL AND MORPHOMETRIC ANALYSIS PERFORMED MANUALLY Estrogen Receptor: 100%, POSITIVE, STRONG STAINING INTENSITY Progesterone Receptor: 80%, POSITIVE, STRONG STAINING INTENSITY Proliferation Marker Ki67: 15%  Results: HER2 - NEGATIVE RATIO OF HER2/CEP17 SIGNALS 1.09 AVERAGE HER2 COPY NUMBER PER CELL 1.90   Diagnosis 08/24/2015 1. Breast, simple mastectomy, Right - FIBROCYSTIC CHANGES WITH ADENOSIS. - PSEUDOANGIOMATOUS STROMAL HYPERPLASIA ([place]). - USUAL DUCTAL  HYPERPLASIA. - THERE IS NO EVIDENCE OF CARCINOMA IN 7 OF 7 LYMPH NODES (0/7). - THERE IS NO EVIDENCE OF MALIGNANCY. 2. Breast, simple mastectomy, Left - INVASIVE DUCTAL CARCINOMA GRADE I/III, SPANNING  1.3 CM. - DUCTAL CARCINOMA IN SITU, LOW GRADE. - LYMPHOVASCULAR INVASION IS IDENTIFIED. - THERE IS NO EVIDENCE OF CARCINOMA IN 4 OF 4 LYMPH NODES (0/4). - THE SURGICAL RESECTION MARGINS ARE NEGATIVE FOR CARCINOMA. - SEE ONCOLOGY TABLE BELOW. 3. Lymph node, sentinel, biopsy, Left axillary - THERE IS NO EVIDENCE OF CARCINOMA IN 1 OF 1 LYMPH NODE (0/1). 4. Lymph node, sentinel, biopsy, Left axillary - THERE IS NO EVIDENCE OF CARCINOMA IN 1 OF 1 LYMPH NODE (0/1). 5. Lymph node, sentinel, biopsy, Left axillary - THERE IS NO EVIDENCE OF CARCINOMA IN 1 OF 1 LYMPH NODE (0/1). 6. Lymph node, sentinel, biopsy, Left axillary - THERE IS NO EVIDENCE OF CARCINOMA IN 1 OF 1 LYMPH NODE (0/1). 7. Lymph node, sentinel, biopsy, Left axillary - THERE IS NO EVIDENCE OF CARCINOMA IN 1 OF 1 LYMPH NODE (0/1). Microscopic Comment 2. BREAST, INVASIVE TUMOR, WITH LYMPH NODES PRESENT Specimen, including laterality and lymph node sampling (sentinel, non-sentinel): Left breast and left axillary lymph nodes. Procedure: Simple mastectomy and axillary lymph node resections. 1 of 4 FINAL for KIMRA, KANTOR ([id number]) Microscopic Comment(continued) Histologic type: Ductal. Grade: 1. Tubule formation: 2. Nuclear pleomorphism: 2. Mitotic: 1. Tumor size (gross measurement): 1.3 cm. Margins: Greater than 0.2 cm to all margins. Lymphovascular invasion: Present. Ductal carcinoma in situ: Present. Grade: Low grade. Lobular neoplasia: Not identified. Tumor focality: Unifocal. Treatment effect: N/A. Extent of tumor: Confined to breast parenchyma. Lymph nodes: Examined: 5 Sentinel 4 Non-sentinel 9 Total Lymph nodes with metastasis: 0. Breast prognostic profile: [id number]. Estrogen receptor: 100%,  strong. Progesterone receptor: 80%, strong. Her 2 neu: No amplification was detected. Ki-67: 15%. Non-neoplastic breast: No significant findings. TNM: pT1c, pN0. (JBK:ds 08/29/15)  ONCOTYPE DX RS: 8, which predicts 10-y distant recurrence risk of 6%    RADIOGRAPHIC STUDIES: I have personally reviewed the radiological images as listed and agreed with the findings in the report. Nm Sentinel Node Inj-no Rpt (breast)  Result Date: 08/24/2015 CLINICAL DATA: left axillary sn biopsy Sulfur colloid was injected intradermally by the nuclear medicine technologist for breast cancer sentinel node localization.    ASSESSMENT & PLAN: 50 year old premenopausal Caucasian woman  1. 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 She has low risk based on the recurrence score, which predicts 10 year distant recurrence after 5 years of tamoxifen 6%. There is no benefit of chemotherapy in the low risk group and I would not recommend it. She is quite pleased with the test result.  --Giving her strongly ER and PR positivity of the tumor cells, and her pre-menopause status, I recommend adjuvant endocrine therapy with tamoxifen for 10 years. The potential side effects, which includes but not limited to, hot flash, skin and vaginal dryness, slightly increased risk of cardiovascular disease and cataract, small risk of thrombosis and endometrial cancer, were discussed with her in great details. Preventive strategies for thrombosis, such as being physically active, using compression stocks, avoid cigarette smoking, etc., were reviewed with her. I also recommend her to follow-up with her gynecologist once a year, and watch for vaginal spotting or bleeding, as a clinically sign of endometrial cancer,  etc. She voiced good understanding, and agrees to proceed. Will start after she completes adjuvant breast radiation. -Alternative adjuvant endocrine therapy, such as aromatase inhibitor and overexpression were  discussed with her also, given her young age. Due to her very early stage breast cancer and age, I do not strongly feel this is necessary. -We also reviewed the breast cancer surveillance, including annual mammogram, and physical exam.  -She would not need postmastectomy radiation -We finally discussed breast cancer surveillance after her surgery, which includes self exam, and  routine office visits with lab and physical exam. She will not need mammogram after bilateral mastectomy. -I encouraged her to have healthy diet and exercise regularly  Plan -I called in tamoxifen 8m dialy today, she will start on 10/02/2015 -lab today -I'll see her back with flap in 2 months   All questions were answered. The patient knows to call the clinic with any problems, questions or concerns.  I spent 25 minutes counseling the patient face to face. The total time spent in the appointment was 30 minutes and more than 50% was on counseling.     FTruitt Merle MD 09/19/2015

## 2015-09-20 ENCOUNTER — Ambulatory Visit: Payer: Managed Care, Other (non HMO) | Attending: General Surgery | Admitting: Physical Therapy

## 2015-09-20 ENCOUNTER — Encounter: Payer: Self-pay | Admitting: Hematology

## 2015-09-20 DIAGNOSIS — M25612 Stiffness of left shoulder, not elsewhere classified: Secondary | ICD-10-CM | POA: Insufficient documentation

## 2015-09-20 DIAGNOSIS — M25611 Stiffness of right shoulder, not elsewhere classified: Secondary | ICD-10-CM | POA: Diagnosis not present

## 2015-09-20 NOTE — Therapy (Signed)
[place], [place], [address] Phone: [phone]   Fax:  [phone]  Physical Therapy Evaluation  Patient Details  Name: Patricia Larson MRN: [id number] Date of Birth: [birth year] Referring Provider: Dr. Lucretia Field  Encounter Date: 09/20/2015      PT End of Session - 09/20/15 2006    Visit Number 1   Number of Visits 1   PT Start Time 1525   PT Stop Time 1606   PT Time Calculation (min) 41 min   Activity Tolerance Patient tolerated treatment well   Behavior During Therapy [facility] for tasks assessed/performed      Past Medical History:  Diagnosis Date  . Cancer [facility]) 2017   left breast cancer  . Hyperlipidemia     Past Surgical History:  Procedure Laterality Date  . BREAST BIOPSY  06/28/10   left  . CHOLECYSTECTOMY    . MASTECTOMY W/ SENTINEL NODE BIOPSY Bilateral 08/24/2015   Procedure: BILATERAL TOTAL MASTECTOMY WITH LEFT SENTINEL LYMPH NODE BIOPSY;  Surgeon: Rolm Bookbinder, MD;  Location: [facility];  Service: General;  Laterality: Bilateral;    There were no vitals filed for this visit.       Subjective Assessment - 09/20/15 1530    Subjective Wants to get full ROM.  Has a vibration machine that vibrates her in standing for 10-15 minutes; she says it's to help your joints and flexibility and that she tries to use it everyday.  She is doing exercise from Dr. Donne Hazel and walking.   Pertinent History Diagnosed with left breast cancer on 07/06/15.  Bilateral mastectomies 08/24/15 with left sentinel node biopsy (5 nodes removed, all negative).  No other treatment planned except tamoxifen.    Hyperlipidemia; no other health issues.  No orthopedic history.     Patient Stated Goals see if there's more exercises I can do; got a sheet of exercises from Dr. Donne Hazel   Currently in Pain? Yes   Pain Score 3    Pain Location Shoulder  and back   Pain Orientation Right;Left    Pain Descriptors / Indicators Tightness   Pain Type Surgical pain   Aggravating Factors  not having moved in a while   Pain Relieving Factors tylenol            [facility] PT Assessment - 09/20/15 0001      Assessment   Medical Diagnosis left breast cancer, s/p bilateral mastectomies   Referring Provider Dr. Lucretia Field   Onset Date/Surgical Date 08/24/15   Hand Dominance Right     Precautions   Precautions Other (comment)   Precaution Comments cancer precautions     Restrictions   Weight Bearing Restrictions No     Balance Screen   Has the patient fallen in the past 6 months No   Has the patient had a decrease in activity level because of a fear of falling?  No   Is the patient reluctant to leave their home because of a fear of falling?  No     Home Social worker Private residence   Living Arrangements Spouse/significant other   Type of Home Other(Comment)  [place] One level     Prior Function   Level of [place] Other (comment)  doesn't work   Leisure walks 40  minutes 5 days a week; does shoulder exercise from Dr. Donne Hazel     Cognition   Overall Cognitive  Status Within Functional Limits for tasks assessed     Observation/Other Assessments   Skin Integrity bilat. mastectomy incisions healing well and still with some glue in place; right lateral incision has one open spot less than 1 cm. in size that is whitish in the interior; no significant redness seen     Posture/Postural Control   Posture/Postural Control Postural limitations   Postural Limitations Rounded Shoulders;Forward head  significant     ROM / Strength   AROM / PROM / Strength AROM     AROM   AROM Assessment Site Shoulder   Right/Left Shoulder Right;Left   Right Shoulder Flexion 127 Degrees   Right Shoulder ABduction 132 Degrees  toward scaption   Right Shoulder Internal Rotation --  WFL in supine   Right Shoulder External  Rotation --  70 in supine   Left Shoulder Flexion 143 Degrees   Left Shoulder ABduction 165 Degrees   Left Shoulder Internal Rotation --  WFL in supine   Left Shoulder External Rotation --  70 in supine     Palpation   Palpation comment no warmth to the touch across the chest; tissue feels firm to the touch across the chest, like there is some post-surgical swelling; small dogear at right lateral incision area           LYMPHEDEMA/ONCOLOGY QUESTIONNAIRE - 09/20/15 1547      Surgeries   Mastectomy Date 08/24/15     Lymphedema Assessments   Lymphedema Assessments Upper extremities     Right Upper Extremity Lymphedema   10 cm Proximal to Olecranon Process 30.9 cm   Olecranon Process 28 cm   10 cm Proximal to Ulnar Styloid Process 27.9 cm   Just Proximal to Ulnar Styloid Process 18.9 cm   Across Hand at [employer] 19.4 cm   At [place] of 2nd Digit 6.8 cm     Left Upper Extremity Lymphedema   10 cm Proximal to Olecranon Process 30.3 cm   Olecranon Process 27.6 cm   10 cm Proximal to Ulnar Styloid Process 27.2 cm   Just Proximal to Ulnar Styloid Process 18.2 cm   Across Hand at [employer] 19.1 cm   At [place] of 2nd Digit 6.6 cm                        PT Education - 09/20/15 2005    Education provided Yes   Education Details ABC class handout with instruction in stretches; info on ABC class   Person(s) Educated Patient   Methods Explanation;Demonstration;Handout   Comprehension Verbalized understanding;Returned demonstration                [facility] Goals - 09/20/15 2012      CC Long Term Goal  #1   Title Patient will be knowledgeable about home exercises for shoulder ROM.   Status Achieved     CC Long Term Goal  #2   Title Patient will be aware of low lymphedema risk and risk reduction practices   Status Achieved            Plan - 09/20/15 2006    Clinical Impression Statement Patient is one month s/p bilateral  mastectomies and has limited shoulder ROM but is doing okay in her recovery.  She has been doing the four exercises given her by Dr. Donne Hazel.  She was give additional exercise to do and feels she will be able to do an independent  home exercise program without follow-up in clinic here.   Rehab Potential Good   PT Frequency One time visit   PT Treatment/Interventions Therapeutic exercise;Patient/family education;ADLs/Self Care Home Management   PT Next Visit Plan No further visits planned.  She was given additional exercises to do as well as information about and from the ABC class.   PT Home Exercise Plan bilateral shoulder AAROM   Recommended Other Services ABC class   Consulted and Agree with Plan of Care Patient      Patient will benefit from skilled therapeutic intervention in order to improve the following deficits and impairments:  Decreased range of motion, Impaired UE functional use, Decreased knowledge of precautions  Visit Diagnosis: Stiffness of right shoulder, not elsewhere classified - Plan: PT plan of care cert/re-cert  Stiffness of left shoulder, not elsewhere classified - Plan: PT plan of care cert/re-cert     Problem List Patient Active Problem List   Diagnosis Date Noted  . Breast cancer, female, left 08/24/2015  . Family history of breast cancer in female 07/20/2015  . Family history of ovarian cancer 07/20/2015  . Breast cancer of lower-outer quadrant of left female breast ([place]) 07/19/2015  . Mass of breast, left 07/11/2010    Asar Evilsizer 09/20/2015, 8:16 PM  [place], [place], [address] Phone: [phone]   Fax:  [phone]  Name: TAMIRA RYLAND MRN: [id number] Date of Birth: 07-08-1965 PHYSICAL THERAPY DISCHARGE SUMMARY  Visits from Start of Care: 1  Current functional level related to goals / functional outcomes: Goals met as noted above.   Remaining  deficits: Bilateral shoulder AROM deficits; these should improve with independent exercise.   Education / Equipment: Home exercise program instruction, ABC class info. Plan: Patient agrees to discharge.  Patient goals were met. Patient is being discharged due to meeting the stated rehab goals.  ?????    Serafina Royals, PT 09/20/15 8:18 PM

## 2015-09-21 ENCOUNTER — Other Ambulatory Visit: Payer: Self-pay | Admitting: *Deleted

## 2015-09-21 DIAGNOSIS — C50512 Malignant neoplasm of lower-outer quadrant of left female breast: Secondary | ICD-10-CM

## 2015-09-26 DIAGNOSIS — Z1379 Encounter for other screening for genetic and chromosomal anomalies: Secondary | ICD-10-CM | POA: Insufficient documentation

## 2015-09-26 NOTE — Progress Notes (Signed)
GENETIC TEST RESULT  HPI: Patricia Larson was previously seen in the [facility] clinic due to a personal and family history of breast cancer and concerns regarding a hereditary predisposition to cancer. Please refer to our prior cancer genetics clinic note from July 19, 2015 for more information regarding Patricia Larson's medical, social and family histories, and our assessment and recommendations, at the time. Patricia Larson's recent genetic test results were disclosed to her, as were recommendations warranted by these results. These results and recommendations are discussed in more detail below.  GENETIC TEST RESULTS: At the time of Patricia Larson's visit on 07/19/15, we recommended she pursue genetic testing of the 20-gene Breast/Ovarian Cancer Panel with MSH2 Exons 1-7 Inversion Analysis through [facility] [employer] [facility]. The Breast/Ovarian Cancer Panel offered by [facility] Hope Pigeon, MD) includes sequencing and deletion/duplication analysis for the following 19 genes:  ATM, BARD1, BRCA1, BRCA2, BRIP1, CDH1, CHEK2, FANCC, MLH1, MSH2, MSH6, NBN, PALB2, PMS2, PTEN, RAD51C, RAD51D, TP53, and XRCC2.  This panel also includes deletion/duplication analysis (without sequencing) for one gene, EPCAM.  Those results are now back, the report date for which is July 29, 2015.  Genetic testing was normal, and did not reveal a deleterious mutation in these genes.  Additionally, on variants of uncertain significance (VUSes) were found.  The test report will be scanned into EPIC and will be located under the Results Review tab in the Pathology>Molecular Pathology section.   We discussed with Patricia Larson that since the current genetic testing is not perfect, it is possible there may be a gene mutation in one of these genes that current testing cannot detect, but that chance is small. We also discussed, that it is possible that another gene that has not yet been discovered, or that we have not yet tested, is  responsible for the cancer diagnoses in the family, and it is, therefore, important to remain in touch with cancer genetics in the future so that we can continue to offer Patricia Larson the most up-to-date genetic testing.    CANCER SCREENING RECOMMENDATIONS: Given Patricia Larson's personal and family histories, we must interpret these negative results with some caution.  Families with features suggestive of hereditary risk for cancer tend to have multiple family members with cancer, diagnoses in multiple generations and diagnoses before the age of 7. Patricia Larson's family exhibits some of these features. Thus this result may simply reflect our current inability to detect all mutations within these genes or there may be a different gene that has not yet been discovered or tested. Further testing of affected family members would also be helpful to further understanding of the personal and familial cancer risks.  In the meantime, Patricia Larson should continue to follow her doctors' recommendations for future cancer screening.  She has decided to undergo bilateral mastectomies.  RECOMMENDATIONS FOR FAMILY MEMBERS: Women in this family are at an increased risk of developing breast and ovarian cancer, over the general population risk, simply due to the family history of these cancers. Men in the family are likely at some increased risk for prostate cancer.  We recommended women in this family have a yearly mammogram beginning at age 16, or 41 years younger than the earliest onset of cancer, an annual clinical breast exam, and perform monthly breast self-exams. Women in this family should also have a gynecological exam as recommended by their primary provider.  Patricia Larson's sisters should be sure that their primary physicians are aware of the family history of cancer, as  they should be eligible for breast MRIs/additional breast cancer screening.  All family members should have a colonoscopy by age 47.  Based on Patricia Larson's  family history, we recommended her other affected relatives, such as her paternal aunts and paternal first cousins, who have a history of breast cancer, have genetic counseling and testing. Patricia Larson will let us know if we can be of any assistance in coordinating genetic counseling and/or testing for these family members.   FOLLOW-UP: Lastly, we discussed with Patricia Larson that cancer genetics is a rapidly advancing field and it is possible that new genetic tests will be appropriate for her and/or her family members in the future. We encouraged her to remain in contact with cancer genetics on an annual basis so we can update her personal and family histories and let her know of advances in cancer genetics that may benefit this family.   Our contact number was provided. Patricia Larson's questions were answered to her satisfaction, and she knows she is welcome to call us at anytime with additional questions or concerns.   Jeanine Luz, MS, [facility] Certified Genetic Counselor [email or web address] Phone: [phone]

## 2015-12-14 ENCOUNTER — Encounter: Payer: Self-pay | Admitting: Adult Health

## 2015-12-19 ENCOUNTER — Other Ambulatory Visit (HOSPITAL_BASED_OUTPATIENT_CLINIC_OR_DEPARTMENT_OTHER): Payer: Managed Care, Other (non HMO)

## 2015-12-19 ENCOUNTER — Ambulatory Visit (HOSPITAL_BASED_OUTPATIENT_CLINIC_OR_DEPARTMENT_OTHER): Payer: Managed Care, Other (non HMO) | Admitting: Hematology

## 2015-12-19 ENCOUNTER — Telehealth: Payer: Self-pay | Admitting: Hematology

## 2015-12-19 ENCOUNTER — Encounter: Payer: Self-pay | Admitting: Hematology

## 2015-12-19 VITALS — BP 129/65 | HR 86 | Temp 98.4°F | Resp 16 | Ht 63.0 in | Wt 173.3 lb

## 2015-12-19 DIAGNOSIS — G4700 Insomnia, unspecified: Secondary | ICD-10-CM

## 2015-12-19 DIAGNOSIS — C50512 Malignant neoplasm of lower-outer quadrant of left female breast: Secondary | ICD-10-CM

## 2015-12-19 DIAGNOSIS — Z170 Estrogen receptor positive status [ER+]: Secondary | ICD-10-CM | POA: Diagnosis not present

## 2015-12-19 DIAGNOSIS — Z79810 Long term (current) use of selective estrogen receptor modulators (SERMs): Secondary | ICD-10-CM | POA: Diagnosis not present

## 2015-12-19 LAB — CBC WITH DIFFERENTIAL/PLATELET
BASO%: 0.4 % (ref 0.0–2.0)
BASOS ABS: 0 10*3/uL (ref 0.0–0.1)
EOS%: 2.5 % (ref 0.0–7.0)
Eosinophils Absolute: 0.2 10*3/uL (ref 0.0–0.5)
HEMATOCRIT: 38.2 % (ref 34.8–46.6)
HGB: 12.8 g/dL (ref 11.6–15.9)
LYMPH#: 2.1 10*3/uL (ref 0.9–3.3)
LYMPH%: 27 % (ref 14.0–49.7)
MCH: 29.6 pg (ref 25.1–34.0)
MCHC: 33.5 g/dL (ref 31.5–36.0)
MCV: 88.2 fL (ref 79.5–101.0)
MONO#: 0.3 10*3/uL (ref 0.1–0.9)
MONO%: 4.5 % (ref 0.0–14.0)
NEUT#: 5 10*3/uL (ref 1.5–6.5)
NEUT%: 65.6 % (ref 38.4–76.8)
Platelets: 231 10*3/uL (ref 145–400)
RBC: 4.33 10*6/uL (ref 3.70–5.45)
RDW: 12.6 % (ref 11.2–14.5)
WBC: 7.6 10*3/uL (ref 3.9–10.3)

## 2015-12-19 LAB — COMPREHENSIVE METABOLIC PANEL
ALT: 23 U/L (ref 0–55)
AST: 19 U/L (ref 5–34)
Albumin: 3.5 g/dL (ref 3.5–5.0)
Alkaline Phosphatase: 95 U/L (ref 40–150)
Anion Gap: 9 mEq/L (ref 3–11)
BUN: 12.9 mg/dL (ref 7.0–26.0)
CALCIUM: 9.3 mg/dL (ref 8.4–10.4)
CHLORIDE: 104 meq/L (ref 98–109)
CO2: 29 mEq/L (ref 22–29)
Creatinine: 0.8 mg/dL (ref 0.6–1.1)
EGFR: 81 mL/min/{1.73_m2} — ABNORMAL LOW (ref >90)
Glucose: 160 mg/dl — ABNORMAL HIGH (ref 70–140)
POTASSIUM: 3.9 meq/L (ref 3.5–5.1)
Sodium: 142 mEq/L (ref 136–145)
Total Bilirubin: 0.22 mg/dL (ref 0.20–1.20)
Total Protein: 7.1 g/dL (ref 6.4–8.3)

## 2015-12-19 MED ORDER — TAMOXIFEN CITRATE 20 MG PO TABS: 20.0000 mg | ORAL_TABLET | Freq: Every day | ORAL | 1 refills | Status: DC

## 2015-12-19 NOTE — Telephone Encounter (Signed)
Appointments scheduled per 12/18 LOS. Patient given AVS report and calendars with future scheduled appointments.  °

## 2015-12-19 NOTE — Progress Notes (Signed)
[place]  Telephone:([phone] [phone]  Clinic Follow Up Note   Patient Care Team: Patricia Kick, MD as PCP - General (Obstetrics and Gynecology) Patricia Bookbinder, MD as Consulting Physician (General Surgery) 12/19/2015   CHIEF COMPLAINTS:  Follow up left breast cancer  Oncology History   Breast cancer of lower-outer quadrant of left female breast [facility])   Staging form: Breast, AJCC 7th Edition   - Clinical stage from 07/04/2015: Stage IA (T1b, N0, M0) - Signed by Patricia Merle, MD on 07/19/2015   - Pathologic stage from 08/24/2015: Stage IA (T1c, N0, cM0) - Signed by Patricia Merle, MD on 09/19/2015      Breast cancer of lower-outer quadrant of left female breast ([place])   07/04/2015 Initial Diagnosis    Breast cancer of lower-outer quadrant of left female breast ([place])      07/04/2015 Receptors her2    Your 100% positive, PR 80% positive, HER-2 negative, Ki-67 15%      07/04/2015 Initial Biopsy    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      07/15/2015 Imaging    breast MRI w wo contrast showed biopsy proven malignancy in the lower outer left breast measures 1 x 1 x 0.7 cm. No findings to suggest multifocal or multicentric disease. Right breast was negative.      08/24/2015 Surgery    Bilateral breast mastectomy and sentinel lymph node biopsy      08/24/2015 Pathology Results    Left breast mastectomy showed a 1.3 cm invasive ductal carcinoma, grade 1, low-grade DCIS, LVI (+), surgical margins were negative. 5 left axillary nodes were negative. Right breast mastectomy showed no malignancy, 7 lymph nodes negative.       08/24/2015 Oncotype testing    Recurrence score 8,  which predicts 10 year risk of distant recurrence 6% with tamoxifen      10/02/2015 -  Anti-estrogen oral therapy    Adjuvant tamoxifen 20 mg once daily        HISTORY OF PRESENTING ILLNESS:  Patricia Larson 50 y.o. female is here because of Her recently diagnosed breast cancer. She is accompanied by her husband to my clinic today. She was referred by her breast surgeon Dr. Donne Larson.   She had a begin left lesion (pallioma) in 2012, which was removed by Dr. Donne Larson. She has been doing well since then, had a screening mammogram every year. On the recent screening mammogram in early July, asymmetrically architecture distortion in the left outer quadrant of the left breast was found. She underwent diagnostic mammogram and ultrasound, which showed a 0.6 cm in mass in the prior surgical site, 3 cm from the nipple. Axillary was negative for lymphadenopathy. She underwent core needle biopsy which showed invasive ductal carcinoma, ER/PR strongly positive, HER-2 negative, Ki-67 15%.  She feels well, no symptoms. She has mild pain in right elbow in the past few weeks, no other pain symptoms. She has good appetite and energy level, weight stable.   GYN HISTORY  Menarchal: 11 LMP: 07/08/2015 Contraceptive: 28 stopped in 2012  HRT: n/a  G0P0:  CURRENT THERAPY: Tamoxifen 20 mg daily, started on 10/02/2015  INTERIM HISTORY:  Patricia Larson returns  for follow up. She has been on tamoxifen for the past one half months, she has been tolerating tamoxifen aware well, she has mild hot flash, especially at night, but overall manageable. No other significant noticeable side effects. She used to take Benadryl for insomnia, but she didn't some research and found there is potential interaction between tamoxifen and Benadryl, and she has not been using Benadryl.   MEDICAL HISTORY:  Past Medical History:  Diagnosis Date  . Cancer [facility]) 2017   left breast cancer  . Hyperlipidemia     SURGICAL HISTORY: Past Surgical  History:  Procedure Laterality Date  . BREAST BIOPSY  06/28/10   left  . CHOLECYSTECTOMY    . MASTECTOMY W/ SENTINEL NODE BIOPSY Bilateral 08/24/2015   Procedure: BILATERAL TOTAL MASTECTOMY WITH LEFT SENTINEL LYMPH NODE BIOPSY;  Surgeon: Patricia Bookbinder, MD;  Location: [place];  Service: General;  Laterality: Bilateral;    SOCIAL HISTORY: Social History   Social History  . Marital status: Married    Spouse name: N/A  . Number of children: N/A  . Years of education: N/A   Occupational History  . Not on file.   Social History Main Topics  . Smoking status: Never Smoker  . Smokeless tobacco: Never Used  . Alcohol use 1.8 oz/week    3 Standard drinks or equivalent per week     Comment: 1-2 a week; sometime only 1 per month - generally wine or occ beer  . Drug use: No  . Sexual activity: Not on file   Other Topics Concern  . Not on file   Social History Narrative  . No narrative on file    FAMILY HISTORY: Family History  Problem Relation Age of Onset  . Prostate cancer Father 66    treated with surgery and hormonal pills; metastasis to bones at 66y  . Heart failure Mother   . Hypertension Mother   . Other Mother     hx of hysterectomy at age 69  . Hypertension Sister   . Other Sister     one sister had hysterectomy at 40 and BSO at 77 due to abnormal bleeding  . Ovarian cancer Maternal Aunt     dx late 60s-early 70s  . Breast cancer Paternal Aunt     dx. 31s  . Prostate cancer Paternal Uncle     dx. 54s  . Breast cancer Paternal Aunt     dx. 2-70  . Breast cancer Cousin     (x3) paternal 1st cousins dx. breast cancer in their 38s (all share the same parent)  . Lung cancer Paternal Uncle     d. 32s  . Heart Problems Brother     d. 85  . Cancer Cousin 24    maternal 1st cousin dx. NOS cancer  . Breast cancer Paternal Aunt     dx. 65s  . Prostate cancer Paternal Uncle     d. 56s  . Ovarian cancer Other     paternal great aunt (PGF's  sister) d. gyn cancer - likely ovarian in her 58s-70  . Breast cancer Cousin 33    paternal 1st cousin, once-removed; (PGF's brother's daughter)    ALLERGIES:  has No Known Allergies.  MEDICATIONS:  Current Outpatient Prescriptions  Medication Sig Dispense Refill  . celecoxib (CELEBREX) 200 MG capsule Take 200 mg by mouth as needed.     . Cholecalciferol (VITAMIN D3) 1000 units CAPS Take by mouth.    . Multiple Vitamin (  MULTIVITAMIN PO) Take by mouth daily.      . Omega-3 Fatty Acids (FISH OIL) 1200 MG CAPS Take 1 capsule by mouth daily.    Marland Kitchen oxyCODONE (OXY IR/ROXICODONE) 5 MG immediate release tablet Take 1-2 tablets (5-10 mg total) by mouth every 4 (four) hours as needed for moderate pain. (Patient not taking: Reported on 09/20/2015) 30 tablet 0  . simvastatin (ZOCOR) 20 MG tablet Take 20 mg by mouth daily.    . tamoxifen (NOLVADEX) 20 MG tablet Take 1 tablet (20 mg total) by mouth daily. (Patient not taking: Reported on 09/20/2015) 30 tablet 2   No current facility-administered medications for this visit.      REVIEW OF SYSTEMS:   Constitutional: Denies fevers, chills or abnormal night sweats Eyes: Denies blurriness of vision, double vision or watery eyes Ears, nose, mouth, throat, and face: Denies mucositis or sore throat Respiratory: Denies cough, dyspnea or wheezes Cardiovascular: Denies palpitation, chest discomfort or lower extremity swelling Gastrointestinal:  Denies nausea, heartburn or change in bowel habits Skin: Denies abnormal skin rashes Lymphatics: Denies new lymphadenopathy or easy bruising Neurological:Denies numbness, tingling or new weaknesses Behavioral/Psych: Mood is stable, no new changes  All other systems were reviewed with the patient and are negative.  PHYSICAL EXAMINATION: ECOG PERFORMANCE STATUS: 0 - Asymptomatic  Vitals:   12/19/15 1508  BP: 129/65  Pulse: 86  Resp: 16  Temp: 98.4 F (36.9 C)   Filed Weights   12/19/15 1508  Weight: 173 lb  4.8 oz (78.6 kg)    GENERAL:alert, no distress and comfortable SKIN: skin color, texture, turgor are normal, no rashes or significant lesions EYES: normal, conjunctiva are pink and non-injected, sclera clear OROPHARYNX:no exudate, no erythema and lips, buccal mucosa, and tongue normal  NECK: supple, thyroid normal size, non-tender, without nodularity LYMPH:  no palpable lymphadenopathy in the cervical, axillary or inguinal LUNGS: clear to auscultation and percussion with normal breathing effort HEART: regular rate & rhythm and no murmurs and no lower extremity edema ABDOMEN:abdomen soft, non-tender and normal bowel sounds Musculoskeletal:no cyanosis of digits and no clubbing  PSYCH: alert & oriented x 3 with fluent speech NEURO: no focal motor/sensory deficits Breasts: s/p b/l mastectomy. Surgical incisions has healed well, there is a small wound dishiscence in the right incision, with mild surrounding skin erythema, no discharge.   LABORATORY DATA:  I have reviewed the data as listed CBC Latest Ref Rng & Units 12/19/2015 09/19/2015 06/28/2010  WBC 3.9 - 10.3 10e3/uL 7.6 7.1 -  Hemoglobin 11.6 - 15.9 g/dL 12.8 12.6 14.1  Hematocrit 34.8 - 46.6 % 38.2 37.3 -  Platelets 145 - 400 10e3/uL 231 262 -   CMP Latest Ref Rng & Units 12/19/2015 09/19/2015 06/23/2010  Glucose 70 - 140 mg/dl 160(H) 87 94  BUN 7.0 - 26.0 mg/dL 12.9 13.6 13  Creatinine 0.6 - 1.1 mg/dL 0.8 0.7 0.56  Sodium 136 - 145 mEq/L 142 140 138  Potassium 3.5 - 5.1 mEq/L 3.9 4.1 4.0  Chloride 96 - 112 mEq/L - - 100  CO2 22 - 29 mEq/L _0 Calcium 8.4 - 10.4 mg/dL 9.3 9.5 9.7  Total Protein 6.4 - 8.3 g/dL 7.1 7.2 -  Total Bilirubin 0.20 - 1.20 mg/dL 0.22 0.40 -  Alkaline Phos 40 - 150 U/L 95 111 -  AST 5 - 34 U/L 19 19 -  ALT 0 - 55 U/L 23 19 -   PATHOLOGY REPORTS: Diagnosis 07/04/2015 Breast, left, needle core biopsy, lower outer  quadrant - INVASIVE DUCTAL CARCINOMA. - DUCTAL CARCINOMA IN SITU. - SEE  COMMENT. Microscopic Comment The carcinoma appears grade 1. A breast prognostic profile will be performed and the results reported separately. The results were called to the [facility] on 07/06/2015. (JBK:kh 07/06/15) Results: IMMUNOHISTOCHEMICAL AND MORPHOMETRIC ANALYSIS PERFORMED MANUALLY Estrogen Receptor: 100%, POSITIVE, STRONG STAINING INTENSITY Progesterone Receptor: 80%, POSITIVE, STRONG STAINING INTENSITY Proliferation Marker Ki67: 15%  Results: HER2 - NEGATIVE RATIO OF HER2/CEP17 SIGNALS 1.09 AVERAGE HER2 COPY NUMBER PER CELL 1.90   Diagnosis 08/24/2015 1. Breast, simple mastectomy, Right - FIBROCYSTIC CHANGES WITH ADENOSIS. - PSEUDOANGIOMATOUS STROMAL HYPERPLASIA ([place]). - USUAL DUCTAL HYPERPLASIA. - THERE IS NO EVIDENCE OF CARCINOMA IN 7 OF 7 LYMPH NODES (0/7). - THERE IS NO EVIDENCE OF MALIGNANCY. 2. Breast, simple mastectomy, Left - INVASIVE DUCTAL CARCINOMA GRADE I/III, SPANNING 1.3 CM. - DUCTAL CARCINOMA IN SITU, LOW GRADE. - LYMPHOVASCULAR INVASION IS IDENTIFIED. - THERE IS NO EVIDENCE OF CARCINOMA IN 4 OF 4 LYMPH NODES (0/4). - THE SURGICAL RESECTION MARGINS ARE NEGATIVE FOR CARCINOMA. - SEE ONCOLOGY TABLE BELOW. 3. Lymph node, sentinel, biopsy, Left axillary - THERE IS NO EVIDENCE OF CARCINOMA IN 1 OF 1 LYMPH NODE (0/1). 4. Lymph node, sentinel, biopsy, Left axillary - THERE IS NO EVIDENCE OF CARCINOMA IN 1 OF 1 LYMPH NODE (0/1). 5. Lymph node, sentinel, biopsy, Left axillary - THERE IS NO EVIDENCE OF CARCINOMA IN 1 OF 1 LYMPH NODE (0/1). 6. Lymph node, sentinel, biopsy, Left axillary - THERE IS NO EVIDENCE OF CARCINOMA IN 1 OF 1 LYMPH NODE (0/1). 7. Lymph node, sentinel, biopsy, Left axillary - THERE IS NO EVIDENCE OF CARCINOMA IN 1 OF 1 LYMPH NODE (0/1). Microscopic Comment 2. BREAST, INVASIVE TUMOR, WITH LYMPH NODES PRESENT Specimen, including laterality and lymph node sampling (sentinel, non-sentinel): Left breast and left axillary  lymph nodes. Procedure: Simple mastectomy and axillary lymph node resections. 1 of 4 FINAL for LILLAN, MCCREADIE ([id number]) Microscopic Comment(continued) Histologic type: Ductal. Grade: 1. Tubule formation: 2. Nuclear pleomorphism: 2. Mitotic: 1. Tumor size (gross measurement): 1.3 cm. Margins: Greater than 0.2 cm to all margins. Lymphovascular invasion: Present. Ductal carcinoma in situ: Present. Grade: Low grade. Lobular neoplasia: Not identified. Tumor focality: Unifocal. Treatment effect: N/A. Extent of tumor: Confined to breast parenchyma. Lymph nodes: Examined: 5 Sentinel 4 Non-sentinel 9 Total Lymph nodes with metastasis: 0. Breast prognostic profile: [id number]. Estrogen receptor: 100%, strong. Progesterone receptor: 80%, strong. Her 2 neu: No amplification was detected. Ki-67: 15%. Non-neoplastic breast: No significant findings. TNM: pT1c, pN0. (JBK:ds 08/29/15)  ONCOTYPE DX RS: 8, which predicts 10-y distant recurrence risk of 6%    RADIOGRAPHIC STUDIES: I have personally reviewed the radiological images as listed and agreed with the findings in the report. No results found.  ASSESSMENT & PLAN: 50 y.o. premenopausal Caucasian woman  1. 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 She has low risk based on the recurrence score, which predicts 10 year distant recurrence after 5 years of tamoxifen 6%. There is no benefit of chemotherapy in the low risk group and I would not recommend it. She is quite pleased with the test result.  --She is currently on adjuvant tamoxifen, tolerating well. -She has not had menstrual period since July 2017. We discussed the alternative options of  switching tamoxifen to aromatase inhibitor if she is truly  postmenopausal in a few years. -She is clinically doing well, lab results reviewed with her, including stem was unremarkable, no clinical concern of recurrence -We will continue breast cancer surveillance, which includes self exam, and  routine office visits with lab and physical exam. She will not need mammogram after bilateral mastectomy. -I encouraged her to have healthy diet and exercise regularly -She is scheduled to see her surgeon Dr. Donne Larson in 3 months, I plan to see her back in 6 months.  2. Insomnia -She is to take Benadryl daily at night for insomnia, however she is concerned about interaction between tamoxifen and Benadryl -I discussed with our pharmacist and checked the interaction between potential and tamoxifen. She has mild potential interaction to increased QT prolongation, it's probably not clinical significant, OK to use benadryl as needed.   Plan -I refilled tamoxifen for her, she will continue  --She is scheduled to see her surgeon Dr. Donne Larson in 3 months, I plan to see her back in 6 months.  All questions were answered. The patient knows to call the clinic with any problems, questions or concerns.  I spent 25 minutes counseling the patient face to face. The total time spent in the appointment was 30 minutes and more than 50% was on counseling.     Patricia Merle, MD 12/19/2015

## 2015-12-21 ENCOUNTER — Encounter: Payer: Managed Care, Other (non HMO) | Admitting: Adult Health

## 2015-12-26 ENCOUNTER — Encounter: Payer: Self-pay | Admitting: Hematology

## 2016-03-08 ENCOUNTER — Encounter (HOSPITAL_COMMUNITY): Payer: Self-pay

## 2016-04-14 ENCOUNTER — Other Ambulatory Visit: Payer: Self-pay | Admitting: Hematology

## 2016-04-14 DIAGNOSIS — C50512 Malignant neoplasm of lower-outer quadrant of left female breast: Secondary | ICD-10-CM

## 2016-04-14 DIAGNOSIS — Z170 Estrogen receptor positive status [ER+]: Principal | ICD-10-CM

## 2016-04-17 ENCOUNTER — Other Ambulatory Visit: Payer: Self-pay | Admitting: *Deleted

## 2016-04-17 ENCOUNTER — Encounter: Payer: Self-pay | Admitting: Hematology

## 2016-04-17 DIAGNOSIS — Z170 Estrogen receptor positive status [ER+]: Principal | ICD-10-CM

## 2016-04-17 DIAGNOSIS — C50512 Malignant neoplasm of lower-outer quadrant of left female breast: Secondary | ICD-10-CM

## 2016-04-17 MED ORDER — TAMOXIFEN CITRATE 20 MG PO TABS: 20.0000 mg | ORAL_TABLET | Freq: Every day | ORAL | 1 refills | Status: DC

## 2016-04-18 ENCOUNTER — Other Ambulatory Visit: Payer: Self-pay | Admitting: *Deleted

## 2016-04-18 DIAGNOSIS — Z170 Estrogen receptor positive status [ER+]: Principal | ICD-10-CM

## 2016-04-18 DIAGNOSIS — C50512 Malignant neoplasm of lower-outer quadrant of left female breast: Secondary | ICD-10-CM

## 2016-04-18 MED ORDER — TAMOXIFEN CITRATE 20 MG PO TABS: 20.0000 mg | ORAL_TABLET | Freq: Every day | ORAL | 1 refills | Status: DC

## 2016-05-24 ENCOUNTER — Telehealth: Payer: Self-pay

## 2016-05-24 ENCOUNTER — Encounter: Payer: Self-pay | Admitting: Hematology

## 2016-05-24 NOTE — Telephone Encounter (Signed)
Called and left a message with a new appt due to md out of office  Patricia Larson 

## 2016-05-25 ENCOUNTER — Telehealth: Payer: Self-pay | Admitting: Hematology

## 2016-05-29 ENCOUNTER — Telehealth: Payer: Self-pay | Admitting: Hematology

## 2016-05-29 NOTE — Telephone Encounter (Signed)
Received msg from [facility] regarding appointment change request. Left message on patient's voicemail to call to confirm requested appointment date.

## 2016-06-05 ENCOUNTER — Telehealth: Payer: Self-pay | Admitting: Hematology

## 2016-06-05 ENCOUNTER — Other Ambulatory Visit (HOSPITAL_BASED_OUTPATIENT_CLINIC_OR_DEPARTMENT_OTHER): Payer: BLUE CROSS/BLUE SHIELD

## 2016-06-05 ENCOUNTER — Other Ambulatory Visit: Payer: Managed Care, Other (non HMO)

## 2016-06-05 ENCOUNTER — Ambulatory Visit: Payer: Managed Care, Other (non HMO) | Admitting: Hematology

## 2016-06-05 DIAGNOSIS — C50512 Malignant neoplasm of lower-outer quadrant of left female breast: Secondary | ICD-10-CM

## 2016-06-05 LAB — COMPREHENSIVE METABOLIC PANEL
ALT: 43 U/L (ref 0–55)
AST: 31 U/L (ref 5–34)
Albumin: 3.7 g/dL (ref 3.5–5.0)
Alkaline Phosphatase: 94 U/L (ref 40–150)
Anion Gap: 10 mEq/L (ref 3–11)
BUN: 13.9 mg/dL (ref 7.0–26.0)
CALCIUM: 9.7 mg/dL (ref 8.4–10.4)
CHLORIDE: 103 meq/L (ref 98–109)
CO2: 31 mEq/L — ABNORMAL HIGH (ref 22–29)
Creatinine: 0.9 mg/dL (ref 0.6–1.1)
EGFR: 77 mL/min/{1.73_m2} — AB (ref >90)
GLUCOSE: 116 mg/dL (ref 70–140)
POTASSIUM: 4.5 meq/L (ref 3.5–5.1)
SODIUM: 144 meq/L (ref 136–145)
Total Bilirubin: 0.27 mg/dL (ref 0.20–1.20)
Total Protein: 7.1 g/dL (ref 6.4–8.3)

## 2016-06-05 LAB — CBC WITH DIFFERENTIAL/PLATELET
BASO%: 0.7 % (ref 0.0–2.0)
BASOS ABS: 0 10*3/uL (ref 0.0–0.1)
EOS%: 2.2 % (ref 0.0–7.0)
Eosinophils Absolute: 0.2 10*3/uL (ref 0.0–0.5)
HCT: 41.9 % (ref 34.8–46.6)
HGB: 14 g/dL (ref 11.6–15.9)
LYMPH%: 30.6 % (ref 14.0–49.7)
MCH: 29.7 pg (ref 25.1–34.0)
MCHC: 33.5 g/dL (ref 31.5–36.0)
MCV: 88.8 fL (ref 79.5–101.0)
MONO#: 0.6 10*3/uL (ref 0.1–0.9)
MONO%: 8.8 % (ref 0.0–14.0)
NEUT#: 4 10*3/uL (ref 1.5–6.5)
NEUT%: 57.7 % (ref 38.4–76.8)
Platelets: 239 10*3/uL (ref 145–400)
RBC: 4.72 10*6/uL (ref 3.70–5.45)
RDW: 13.1 % (ref 11.2–14.5)
WBC: 6.9 10*3/uL (ref 3.9–10.3)
lymph#: 2.1 10*3/uL (ref 0.9–3.3)

## 2016-06-05 NOTE — Telephone Encounter (Signed)
Patient had an appointment with Dr Burr Medico today and and took the day off of work to make her appointment and then the Dr appointment was changed.  She will reschedule at a later date but not sure when.

## 2016-06-06 NOTE — Telephone Encounter (Signed)
I called her back and left a message about the scheduling problem. I indicated that I will see her at 4:30pm today if she wants to come back, or reschedule to a later date.   Truitt Merle MD

## 2016-06-07 ENCOUNTER — Other Ambulatory Visit: Payer: Managed Care, Other (non HMO)

## 2016-06-07 ENCOUNTER — Ambulatory Visit: Payer: Managed Care, Other (non HMO) | Admitting: Hematology

## 2016-09-15 ENCOUNTER — Other Ambulatory Visit: Payer: Self-pay | Admitting: Hematology

## 2016-09-15 DIAGNOSIS — C50512 Malignant neoplasm of lower-outer quadrant of left female breast: Secondary | ICD-10-CM

## 2016-09-15 DIAGNOSIS — Z170 Estrogen receptor positive status [ER+]: Principal | ICD-10-CM

## 2016-09-18 ENCOUNTER — Encounter: Payer: Self-pay | Admitting: Hematology

## 2016-09-20 ENCOUNTER — Encounter: Payer: Self-pay | Admitting: Hematology

## 2016-09-20 ENCOUNTER — Other Ambulatory Visit: Payer: Self-pay | Admitting: Hematology

## 2016-09-20 DIAGNOSIS — Z170 Estrogen receptor positive status [ER+]: Principal | ICD-10-CM

## 2016-09-20 DIAGNOSIS — C50512 Malignant neoplasm of lower-outer quadrant of left female breast: Secondary | ICD-10-CM

## 2016-09-20 MED ORDER — TAMOXIFEN CITRATE 20 MG PO TABS: 20.0000 mg | ORAL_TABLET | Freq: Every day | ORAL | 0 refills | Status: DC

## 2016-11-26 ENCOUNTER — Telehealth: Payer: Self-pay | Admitting: Hematology

## 2016-11-26 ENCOUNTER — Ambulatory Visit (HOSPITAL_BASED_OUTPATIENT_CLINIC_OR_DEPARTMENT_OTHER): Payer: BLUE CROSS/BLUE SHIELD | Admitting: Hematology

## 2016-11-26 ENCOUNTER — Other Ambulatory Visit (HOSPITAL_BASED_OUTPATIENT_CLINIC_OR_DEPARTMENT_OTHER): Payer: BLUE CROSS/BLUE SHIELD

## 2016-11-26 VITALS — BP 142/71 | HR 81 | Temp 98.1°F | Resp 20 | Ht 63.0 in | Wt 175.4 lb

## 2016-11-26 DIAGNOSIS — Z170 Estrogen receptor positive status [ER+]: Secondary | ICD-10-CM | POA: Diagnosis not present

## 2016-11-26 DIAGNOSIS — G4700 Insomnia, unspecified: Secondary | ICD-10-CM

## 2016-11-26 DIAGNOSIS — C50512 Malignant neoplasm of lower-outer quadrant of left female breast: Secondary | ICD-10-CM

## 2016-11-26 DIAGNOSIS — Z79810 Long term (current) use of selective estrogen receptor modulators (SERMs): Secondary | ICD-10-CM | POA: Diagnosis not present

## 2016-11-26 LAB — COMPREHENSIVE METABOLIC PANEL
ALBUMIN: 3.8 g/dL (ref 3.5–5.0)
ALK PHOS: 94 U/L (ref 40–150)
ALT: 41 U/L (ref 0–55)
ANION GAP: 10 meq/L (ref 3–11)
AST: 30 U/L (ref 5–34)
BUN: 16.8 mg/dL (ref 7.0–26.0)
CHLORIDE: 103 meq/L (ref 98–109)
CO2: 27 mEq/L (ref 22–29)
CREATININE: 0.9 mg/dL (ref 0.6–1.1)
Calcium: 9.8 mg/dL (ref 8.4–10.4)
EGFR: >60 mL/min/{1.73_m2} (ref >60)
Glucose: 121 mg/dl (ref 70–140)
POTASSIUM: 5 meq/L (ref 3.5–5.1)
SODIUM: 140 meq/L (ref 136–145)
Total Bilirubin: 0.24 mg/dL (ref 0.20–1.20)
Total Protein: 7.3 g/dL (ref 6.4–8.3)

## 2016-11-26 LAB — CBC WITH DIFFERENTIAL/PLATELET
BASO%: 0.4 % (ref 0.0–2.0)
Basophils Absolute: 0 10*3/uL (ref 0.0–0.1)
EOS ABS: 0.1 10*3/uL (ref 0.0–0.5)
EOS%: 1.8 % (ref 0.0–7.0)
HCT: 40.2 % (ref 34.8–46.6)
HEMOGLOBIN: 13.4 g/dL (ref 11.6–15.9)
LYMPH%: 32.8 % (ref 14.0–49.7)
MCH: 30.1 pg (ref 25.1–34.0)
MCHC: 33.3 g/dL (ref 31.5–36.0)
MCV: 90.3 fL (ref 79.5–101.0)
MONO#: 0.4 10*3/uL (ref 0.1–0.9)
MONO%: 5.5 % (ref 0.0–14.0)
NEUT%: 59.5 % (ref 38.4–76.8)
NEUTROS ABS: 4 10*3/uL (ref 1.5–6.5)
Platelets: 229 10*3/uL (ref 145–400)
RBC: 4.45 10*6/uL (ref 3.70–5.45)
RDW: 12.7 % (ref 11.2–14.5)
WBC: 6.8 10*3/uL (ref 3.9–10.3)
lymph#: 2.2 10*3/uL (ref 0.9–3.3)

## 2016-11-26 MED ORDER — TAMOXIFEN CITRATE 20 MG PO TABS: 20.0000 mg | ORAL_TABLET | Freq: Every day | ORAL | 3 refills | Status: DC

## 2016-11-26 NOTE — Telephone Encounter (Signed)
Gave avs and calendar for May 2019 °

## 2016-11-26 NOTE — Progress Notes (Signed)
[name]  [phone]) [phone] [phone]) [phone]  Clinic Follow Up Note   Patient Care Team: Vanessa Kick, MD as PCP - General (Obstetrics and Gynecology) Rolm Bookbinder, MD as Consulting Physician (General Surgery) 11/26/2016   CHIEF COMPLAINTS:  Follow up left breast cancer  Oncology History   Breast cancer of lower-outer quadrant of left female breast [facility])   Staging form: Breast, AJCC 7th Edition   - Clinical stage from 07/04/2015: Stage IA (T1b, N0, M0) - Signed by Truitt Merle, MD on 07/19/2015   - Pathologic stage from 08/24/2015: Stage IA (T1c, N0, cM0) - Signed by Truitt Merle, MD on 09/19/2015      Breast cancer of 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      07/15/2015 Imaging    breast MRI w wo contrast showed biopsy proven malignancy in the lower outer left breast measures 1 x 1 x 0.7 cm. No findings to suggest multifocal or multicentric disease. Right breast was negative.      08/24/2015 Surgery    Bilateral breast mastectomy and sentinel lymph node biopsy      08/24/2015 Pathology Results    Left breast mastectomy showed a 1.3 cm invasive ductal carcinoma, grade 1, low-grade DCIS, LVI (+), surgical margins were negative. 5 left axillary nodes were negative. Right breast mastectomy showed no malignancy, 7 lymph nodes negative.       08/24/2015 Oncotype testing    Recurrence score 8,  which predicts 10 year risk of distant recurrence 6% with tamoxifen      10/02/2015 -  Anti-estrogen oral therapy    Adjuvant tamoxifen 20 mg once daily        HISTORY OF PRESENTING ILLNESS:  Patricia Larson 51 y.o. female is here because of Her recently diagnosed breast cancer. She is accompanied by her husband to my clinic today. She was referred by her breast surgeon Dr. Donne Hazel.   She had a begin left lesion (pallioma) in 2012, which was removed by Dr. Donne Hazel. She has been doing well since then, had a screening mammogram every year. On the recent screening mammogram in early July, asymmetrically architecture distortion in the left outer quadrant of the left breast was found. She underwent diagnostic mammogram and ultrasound, which showed a 0.6 cm in mass in the prior surgical site, 3 cm from the nipple. Axillary was negative for lymphadenopathy. She underwent core needle biopsy which showed invasive ductal carcinoma, ER/PR strongly positive, HER-2 negative, Ki-67 15%.  She feels well, no symptoms. She has mild pain in right elbow in the past few weeks, no other pain symptoms. She has good appetite and energy level, weight stable.   GYN HISTORY  Menarchal: 11 LMP: 07/08/2015 Contraceptive: 30 stopped in 2012  HRT: n/a  G0P0:  CURRENT THERAPY: Tamoxifen 20 mg daily, started on 10/02/2015  INTERIM HISTORY:  Patricia Larson returns  for follow up. She was last seen by me 11 months ago. She presents to the clinic today reporting hot flashes that are manageable. She thinks that are getting worse over time. She takes 60m Melatonin to help her sleep. She notes back pain and shoulder pain that is now resolved. No other joint pain. For exercise she walks and a vibration machine. she denies lymphedema. This has helped her tremendously. She has not seen Dr. WDonne Hazelthis year yet. She does not plan to do reconstruction. She uses a prosthesis. She stopped having periods once starting Tamoxifen. Before  Tamoxifen she had overall regular periods.     MEDICAL HISTORY:  Past Medical History:  Diagnosis Date  . Cancer ([facility] 2017   left breast cancer  . Hyperlipidemia     SURGICAL HISTORY: Past Surgical History:  Procedure Laterality Date  . BREAST BIOPSY  06/28/10   left  . CHOLECYSTECTOMY    . MASTECTOMY W/ SENTINEL NODE BIOPSY Bilateral 08/24/2015   Procedure: BILATERAL TOTAL MASTECTOMY WITH LEFT SENTINEL LYMPH NODE BIOPSY;  Surgeon: MRolm Bookbinder MD;  Location: [facility]  Service: General;  Laterality: Bilateral;    SOCIAL HISTORY: Social History   Socioeconomic History  . Marital status: Married    Spouse name: Not on file  . Number of children: Not on file  . Years of education: Not on file  . Highest education level: Not on file  Social Needs  . Financial resource strain: Not on file  . Food insecurity - worry: Not on file  . Food insecurity - inability: Not on file  . Transportation needs - medical: Not on file  . Transportation needs - non-medical: Not on file  Occupational History  . Not on file  Tobacco Use  . Smoking status: Never Smoker  . Smokeless tobacco: Never Used  Substance and Sexual Activity  . Alcohol use: Yes    Alcohol/week: 1.8 oz    Types: 3 Standard drinks or equivalent per week    Comment: 1-2 a week; sometime only 1 per month - generally wine or occ beer  . Drug use: No  . Sexual activity: Not on file  Other Topics Concern  . Not on file  Social History Narrative  . Not on file    FAMILY HISTORY: Family History  Problem Relation Age of Onset  . Prostate cancer Father 645      treated with surgery and hormonal pills; metastasis to bones at 66y  . Heart failure Mother   . Hypertension Mother   . Other Mother        hx of hysterectomy at age 53 . Hypertension Sister   . Other Sister        one sister had hysterectomy at 212and BSO at 220due to abnormal bleeding  . Ovarian cancer Maternal Aunt        dx late  60s-early 70s  . Breast cancer Paternal Aunt        dx. 654s . Prostate cancer Paternal Uncle        dx. 833s . Breast cancer Paternal Aunt        dx. 686-70 . Breast cancer Cousin        (x3) paternal 1st cousins dx. breast cancer in their 465s(all share the same parent)  . Lung cancer Paternal Uncle        d. 641s . Heart Problems Brother  d. 60  . Cancer Cousin 61       maternal 1st cousin dx. NOS cancer  . Breast cancer Paternal Aunt        dx. 82s  . Prostate cancer Paternal Uncle        d. 58s  . Ovarian cancer Other        paternal great aunt (PGF's sister) d. gyn cancer - likely ovarian in her 52s-70  . Breast cancer Cousin 39       paternal 1st cousin, once-removed; (PGF's brother's daughter)    ALLERGIES:  has No Known Allergies.  MEDICATIONS:  Current Outpatient Medications  Medication Sig Dispense Refill  . celecoxib (CELEBREX) 200 MG capsule Take 200 mg by mouth as needed.     . Cholecalciferol (VITAMIN D3) 1000 units CAPS Take by mouth.    . Melatonin 10 MG TABS Take 10 mg by mouth at bedtime.    . Multiple Vitamin (MULTIVITAMIN PO) Take by mouth daily.      . Omega-3 Fatty Acids (FISH OIL) 1200 MG CAPS Take 1 capsule by mouth daily.    . simvastatin (ZOCOR) 20 MG tablet Take 20 mg by mouth daily.    . tamoxifen (NOLVADEX) 20 MG tablet Take 1 tablet (20 mg total) by mouth daily. 90 tablet 3   No current facility-administered medications for this visit.      REVIEW OF SYSTEMS:   Constitutional: Denies fevers, chills or abnormal night sweats (+) increasing hot flashes  Eyes: Denies blurriness of vision, double vision or watery eyes Ears, nose, mouth, throat, and face: Denies mucositis or sore throat Respiratory: Denies cough, dyspnea or wheezes Cardiovascular: Denies palpitation, chest discomfort or lower extremity swelling Gastrointestinal:  Denies nausea, heartburn or change in bowel habits Skin: Denies abnormal skin rashes Lymphatics: Denies new  lymphadenopathy or easy bruising Neurological:Denies numbness, tingling or new weaknesses Behavioral/Psych: Mood is stable, no new changes  All other systems were reviewed with the patient and are negative.  PHYSICAL EXAMINATION: ECOG PERFORMANCE STATUS: 0 - Asymptomatic  Vitals:   11/26/16 1502  BP: (!) 142/71  Pulse: 81  Resp: 20  Temp: 98.1 F (36.7 C)  SpO2: 100%   Filed Weights   11/26/16 1502  Weight: 175 lb 6.4 oz (79.6 kg)    GENERAL:alert, no distress and comfortable SKIN: skin color, texture, turgor are normal, no rashes or significant lesions EYES: normal, conjunctiva are pink and non-injected, sclera clear OROPHARYNX:no exudate, no erythema and lips, buccal mucosa, and tongue normal  NECK: supple, thyroid normal size, non-tender, without nodularity LYMPH:  no palpable lymphadenopathy in the cervical, axillary or inguinal LUNGS: clear to auscultation and percussion with normal breathing effort HEART: regular rate & rhythm and no murmurs and (+) mild swelling in left ankle  ABDOMEN:abdomen soft, non-tender and normal bowel sounds Musculoskeletal:no cyanosis of digits and no clubbing  PSYCH: alert & oriented x 3 with fluent speech NEURO: no focal motor/sensory deficits Breasts: s/p b/l mastectomy. Surgical incisions has healed well, no nodularity, no discharge.   LABORATORY DATA:  I have reviewed the data as listed CBC Latest Ref Rng & Units 11/26/2016 06/05/2016 12/19/2015  WBC 3.9 - 10.3 10e3/uL 6.8 6.9 7.6  Hemoglobin 11.6 - 15.9 g/dL 13.4 14.0 12.8  Hematocrit 34.8 - 46.6 % 40.2 41.9 38.2  Platelets 145 - 400 10e3/uL 229 239 231   CMP Latest Ref Rng & Units 11/26/2016 06/05/2016 12/19/2015  Glucose 70 - 140 mg/dl 121 116 160(H)  BUN 7.0 -  26.0 mg/dL 16.8 13.9 12.9  Creatinine 0.6 - 1.1 mg/dL 0.9 0.9 0.8  Sodium 136 - 145 mEq/L 140 144 142  Potassium 3.5 - 5.1 mEq/L 5.0 4.5 3.9  Chloride 96 - 112 mEq/L - - -  CO2 22 - 29 mEq/L 27 31(H) 29  Calcium 8.4 -  10.4 mg/dL 9.8 9.7 9.3  Total Protein 6.4 - 8.3 g/dL 7.3 7.1 7.1  Total Bilirubin 0.20 - 1.20 mg/dL 0.24 0.27 0.22  Alkaline Phos 40 - 150 U/L 94 94 95  AST 5 - 34 U/L '30 31 19  ' ALT 0 - 55 U/L 41 43 23   PATHOLOGY REPORTS: Diagnosis 07/04/2015 Breast, left, needle core biopsy, lower outer quadrant - INVASIVE DUCTAL CARCINOMA. - DUCTAL CARCINOMA IN SITU. - SEE COMMENT. Microscopic Comment The carcinoma appears grade 1. A breast prognostic profile will be performed and the results reported separately. The results were called to the [facility] on 07/06/2015. (JBK:kh 07/06/15) Results: IMMUNOHISTOCHEMICAL AND MORPHOMETRIC ANALYSIS PERFORMED MANUALLY Estrogen Receptor: 100%, POSITIVE, STRONG STAINING INTENSITY Progesterone Receptor: 80%, POSITIVE, STRONG STAINING INTENSITY Proliferation Marker Ki67: 15%  Results: HER2 - NEGATIVE RATIO OF HER2/CEP17 SIGNALS 1.09 AVERAGE HER2 COPY NUMBER PER CELL 1.90   Diagnosis 08/24/2015 1. Breast, simple mastectomy, Right - FIBROCYSTIC CHANGES WITH ADENOSIS. - PSEUDOANGIOMATOUS STROMAL HYPERPLASIA ([place]). - USUAL DUCTAL HYPERPLASIA. - THERE IS NO EVIDENCE OF CARCINOMA IN 7 OF 7 LYMPH NODES (0/7). - THERE IS NO EVIDENCE OF MALIGNANCY. 2. Breast, simple mastectomy, Left - INVASIVE DUCTAL CARCINOMA GRADE I/III, SPANNING 1.3 CM. - DUCTAL CARCINOMA IN SITU, LOW GRADE. - LYMPHOVASCULAR INVASION IS IDENTIFIED. - THERE IS NO EVIDENCE OF CARCINOMA IN 4 OF 4 LYMPH NODES (0/4). - THE SURGICAL RESECTION MARGINS ARE NEGATIVE FOR CARCINOMA. - SEE ONCOLOGY TABLE BELOW. 3. Lymph node, sentinel, biopsy, Left axillary - THERE IS NO EVIDENCE OF CARCINOMA IN 1 OF 1 LYMPH NODE (0/1). 4. Lymph node, sentinel, biopsy, Left axillary - THERE IS NO EVIDENCE OF CARCINOMA IN 1 OF 1 LYMPH NODE (0/1). 5. Lymph node, sentinel, biopsy, Left axillary - THERE IS NO EVIDENCE OF CARCINOMA IN 1 OF 1 LYMPH NODE (0/1). 6. Lymph node, sentinel, biopsy, Left  axillary - THERE IS NO EVIDENCE OF CARCINOMA IN 1 OF 1 LYMPH NODE (0/1). 7. Lymph node, sentinel, biopsy, Left axillary - THERE IS NO EVIDENCE OF CARCINOMA IN 1 OF 1 LYMPH NODE (0/1). Microscopic Comment 2. BREAST, INVASIVE TUMOR, WITH LYMPH NODES PRESENT Specimen, including laterality and lymph node sampling (sentinel, non-sentinel): Left breast and left axillary lymph nodes. Procedure: Simple mastectomy and axillary lymph node resections. 1 of 4 FINAL for TEMIA, DEBROUX ([id number]) Microscopic Comment(continued) Histologic type: Ductal. Grade: 1. Tubule formation: 2. Nuclear pleomorphism: 2. Mitotic: 1. Tumor size (gross measurement): 1.3 cm. Margins: Greater than 0.2 cm to all margins. Lymphovascular invasion: Present. Ductal carcinoma in situ: Present. Grade: Low grade. Lobular neoplasia: Not identified. Tumor focality: Unifocal. Treatment effect: N/A. Extent of tumor: Confined to breast parenchyma. Lymph nodes: Examined: 5 Sentinel 4 Non-sentinel 9 Total Lymph nodes with metastasis: 0. Breast prognostic profile: [id number]. Estrogen receptor: 100%, strong. Progesterone receptor: 80%, strong. Her 2 neu: No amplification was detected. Ki-67: 15%. Non-neoplastic breast: No significant findings. TNM: pT1c, pN0. (JBK:ds 08/29/15)  ONCOTYPE DX RS: 8, which predicts 10-y distant recurrence risk of 6%    RADIOGRAPHIC STUDIES: I have personally reviewed the radiological images as listed and agreed with the findings in the report. No results found.  ASSESSMENT & PLAN:  51 y.o. premenopausal Caucasian woman  1. 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 She has low risk based on the recurrence score, which predicts 10 year distant recurrence  after 5 years of tamoxifen 6%. There is no benefit of chemotherapy in the low risk group and I would not recommend it. She is quite pleased with the test result.  --She is currently on adjuvant tamoxifen, tolerating well. -She has not had menstrual period since July 2017. We discussed the alternative options of switching tamoxifen to aromatase inhibitor if she is truly postmenopausal in a few years. -We will continue breast cancer surveillance, which includes self exam, and  routine office visits with lab and physical exam. She will not need mammogram after bilateral mastectomy. -I encouraged her to have healthy diet and exercise regularly -She is clinically doing well, CBC results reviewed and WNL, CMP is still pending. Exam unremarkable, no clinical concern of recurrence. -I encouraged her to stay active to reduce risk of blood clots while on Tamoxifen.  -She is still Perimenopause, she has had not had a menstrual period since she started tamoxifen.  We discussed the option of switching tamoxifen to aromatase inhibitor, when she becomes postmenopausal.  I may consider checking her [place] in a few years.    -We again discussed breast cancer surveillance.  No routine whole body scan, unless she develops concerning symptoms, abdominal or abnormal exam findings.  We reviewed the symptoms of cancer recurrence. -F/u every 6 months with labs.    2. Insomnia -She is to take Benadryl daily at night for insomnia, however she is concerned about interaction between tamoxifen and Benadryl -I discussed with our pharmacist and checked the interaction between potential and tamoxifen. She has mild potential interaction to increased QT prolongation, it's probably not clinical significant, OK to use benadryl as needed.   3. Hot flush -Secondary to tamoxifen, overall manageable  Plan -Conitnue Tamoxifen, refilled today  -Labs and f/u in 6 months   All questions were answered. The patient knows to call the clinic  with any problems, questions or concerns.  I spent 15 minutes counseling the patient face to face. The total time spent in the appointment was 20 minutes and more than 50% was on counseling.     Truitt Merle, MD 11/26/2016   This document serves as a record of services personally performed by Truitt Merle, MD. It was created on her behalf by Joslyn Devon, a trained medical scribe. The creation of this record is based on the scribe's personal observations and the provider's statements to them.    I have reviewed the above documentation for accuracy and completeness, and I agree with the above.

## 2016-11-27 ENCOUNTER — Encounter: Payer: Self-pay | Admitting: Hematology

## 2017-05-29 ENCOUNTER — Inpatient Hospital Stay: Payer: BLUE CROSS/BLUE SHIELD

## 2017-05-29 ENCOUNTER — Inpatient Hospital Stay: Payer: BLUE CROSS/BLUE SHIELD | Attending: Hematology | Admitting: Nurse Practitioner

## 2017-05-29 ENCOUNTER — Encounter: Payer: Self-pay | Admitting: Nurse Practitioner

## 2017-05-29 DIAGNOSIS — G4700 Insomnia, unspecified: Secondary | ICD-10-CM

## 2017-05-29 DIAGNOSIS — N951 Menopausal and female climacteric states: Secondary | ICD-10-CM | POA: Insufficient documentation

## 2017-05-29 DIAGNOSIS — Z79899 Other long term (current) drug therapy: Secondary | ICD-10-CM | POA: Diagnosis not present

## 2017-05-29 DIAGNOSIS — Z79810 Long term (current) use of selective estrogen receptor modulators (SERMs): Secondary | ICD-10-CM | POA: Insufficient documentation

## 2017-05-29 DIAGNOSIS — Z170 Estrogen receptor positive status [ER+]: Secondary | ICD-10-CM

## 2017-05-29 DIAGNOSIS — Z853 Personal history of malignant neoplasm of breast: Secondary | ICD-10-CM | POA: Diagnosis present

## 2017-05-29 DIAGNOSIS — C50512 Malignant neoplasm of lower-outer quadrant of left female breast: Secondary | ICD-10-CM

## 2017-05-29 LAB — CBC WITH DIFFERENTIAL/PLATELET
BASOS PCT: 0 %
Basophils Absolute: 0 10*3/uL (ref 0.0–0.1)
Eosinophils Absolute: 0.2 10*3/uL (ref 0.0–0.5)
Eosinophils Relative: 2 %
HEMATOCRIT: 39 % (ref 34.8–46.6)
HEMOGLOBIN: 13 g/dL (ref 11.6–15.9)
LYMPHS ABS: 2.1 10*3/uL (ref 0.9–3.3)
Lymphocytes Relative: 27 %
MCH: 30 pg (ref 25.1–34.0)
MCHC: 33.3 g/dL (ref 31.5–36.0)
MCV: 90.1 fL (ref 79.5–101.0)
MONO ABS: 0.5 10*3/uL (ref 0.1–0.9)
MONOS PCT: 6 %
NEUTROS ABS: 5.1 10*3/uL (ref 1.5–6.5)
Neutrophils Relative %: 65 %
Platelets: 228 10*3/uL (ref 145–400)
RBC: 4.33 MIL/uL (ref 3.70–5.45)
RDW: 12.7 % (ref 11.2–14.5)
WBC: 7.8 10*3/uL (ref 3.9–10.3)

## 2017-05-29 LAB — COMPREHENSIVE METABOLIC PANEL
ALBUMIN: 3.8 g/dL (ref 3.5–5.0)
ALK PHOS: 105 U/L (ref 40–150)
ALT: 55 U/L (ref 0–55)
ANION GAP: 12 — AB (ref 3–11)
AST: 43 U/L — AB (ref 5–34)
BUN: 15 mg/dL (ref 7–26)
CALCIUM: 9.7 mg/dL (ref 8.4–10.4)
CO2: 27 mmol/L (ref 22–29)
Chloride: 101 mmol/L (ref 98–109)
Creatinine, Ser: 0.87 mg/dL (ref 0.60–1.10)
GFR calc Af Amer: >60 mL/min (ref >60)
GFR calc non Af Amer: >60 mL/min (ref >60)
GLUCOSE: 119 mg/dL (ref 70–140)
Potassium: 4 mmol/L (ref 3.5–5.1)
SODIUM: 140 mmol/L (ref 136–145)
Total Bilirubin: 0.2 mg/dL (ref 0.2–1.2)
Total Protein: 7.1 g/dL (ref 6.4–8.3)

## 2017-05-29 MED ORDER — TAMOXIFEN CITRATE 20 MG PO TABS: 20.0000 mg | ORAL_TABLET | Freq: Every day | ORAL | 3 refills | Status: DC

## 2017-05-29 NOTE — Progress Notes (Signed)
[place]  [phone] [phone]  Clinic Follow up Note   Patient Care Team: Vanessa Kick, MD as PCP - General (Obstetrics and Gynecology) Rolm Bookbinder, MD as Consulting Physician (General Surgery) 05/29/2017  SUMMARY OF ONCOLOGIC HISTORY: Oncology History   Breast cancer of lower-outer quadrant of left female breast [facility])   Staging form: Breast, AJCC 7th Edition   - Clinical stage from 07/04/2015: Stage IA (T1b, N0, M0) - Signed by Truitt Merle, MD on 07/19/2015   - Pathologic stage from 08/24/2015: Stage IA (T1c, N0, cM0) - Signed by Truitt Merle, MD on 09/19/2015      Breast cancer of lower-outer quadrant of left female breast ([place])   07/04/2015 Initial Diagnosis    Breast cancer of lower-outer quadrant of left female breast ([place])      07/04/2015 Receptors her2    Your 100% positive, PR 80% positive, HER-2 negative, Ki-67 15%      07/04/2015 Initial Biopsy    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      07/15/2015 Imaging    breast MRI w wo contrast showed biopsy proven malignancy in the lower outer left breast measures 1 x 1 x 0.7 cm. No findings to suggest multifocal or multicentric disease. Right breast was negative.      08/24/2015 Surgery    Bilateral breast mastectomy and sentinel lymph node biopsy      08/24/2015 Pathology Results    Left breast mastectomy showed a 1.3 cm invasive ductal carcinoma, grade 1, low-grade DCIS, LVI (+), surgical margins were negative. 5 left axillary nodes were negative. Right breast mastectomy showed no malignancy, 7 lymph nodes negative.       08/24/2015 Oncotype testing    Recurrence score 8, which predicts 10 year  risk of distant recurrence 6% with tamoxifen      10/02/2015 -  Anti-estrogen oral therapy    Adjuvant tamoxifen 20 mg once daily     CURRENT THERAPY: Tamoxifen 20 mg daily, started on 10/02/2015   INTERVAL HISTORY: Ms. Zaragoza returns for 51-monthf/u as scheduled. She is doing well without changes in her overall health. She continues tamoxifen daily. Has increased hot flashes that are still tolerable, she is not interested in treating with medication. Has occasional mild fatigue but continues all activities normally. Has good appetite. Walks 2 miles daily. No pain or new bone/joint pain. Has occasional lymphedema to left axilla that improves with activity. She completed PT after mastectomy and continues exercises she learned there. She has not had menses or irregular vaginal bleeding since starting tamoxifen.   REVIEW OF SYSTEMS:   Constitutional: Denies fevers, chills or abnormal weight loss (+) slight weight gain (+) occasional mild fatigue  Ears, nose, mouth, throat, and face: Denies mucositis Respiratory: Denies cough, dyspnea or wheezes Cardiovascular: Denies palpitation, chest discomfort or lower extremity swelling Gastrointestinal:  Denies nausea, vomiting, constipation, diarrhea, heartburn or change in bowel habits (+) occasional epigastric pain  Skin: Denies abnormal skin rashes Lymphatics: Denies new lymphadenopathy or easy bruising (+) intermittent left axillary lymphedema  Neurological:Denies numbness, tingling or new weaknesses (+) hot flashes, increased but tolerable  Behavioral/Psych: Mood is stable, no new changes  All  other systems were reviewed with the patient and are negative.  MEDICAL HISTORY:  Past Medical History:  Diagnosis Date  . Cancer [facility]) 2017   left breast cancer  . Hyperlipidemia     SURGICAL HISTORY: Past Surgical History:  Procedure Laterality Date  . BREAST BIOPSY  06/28/10   left  . CHOLECYSTECTOMY    . MASTECTOMY W/ SENTINEL NODE BIOPSY  Bilateral 08/24/2015   Procedure: BILATERAL TOTAL MASTECTOMY WITH LEFT SENTINEL LYMPH NODE BIOPSY;  Surgeon: Rolm Bookbinder, MD;  Location: [place];  Service: General;  Laterality: Bilateral;    I have reviewed the social history and family history with the patient and they are unchanged from previous note.  ALLERGIES:  has No Known Allergies.  MEDICATIONS:  Current Outpatient Medications  Medication Sig Dispense Refill  . Cholecalciferol (VITAMIN D3) 1000 units CAPS Take by mouth.    . Melatonin 10 MG TABS Take 10 mg by mouth at bedtime.    . Multiple Vitamin (MULTIVITAMIN PO) Take by mouth daily.      . Omega-3 Fatty Acids (FISH OIL) 1200 MG CAPS Take 1 capsule by mouth daily.    . simvastatin (ZOCOR) 20 MG tablet Take 20 mg by mouth daily.    . tamoxifen (NOLVADEX) 20 MG tablet Take 1 tablet (20 mg total) by mouth daily. 90 tablet 3  . celecoxib (CELEBREX) 200 MG capsule Take 200 mg by mouth as needed.      No current facility-administered medications for this visit.     PHYSICAL EXAMINATION: ECOG PERFORMANCE STATUS: 1 - Symptomatic but completely ambulatory  Vitals:   05/29/17 1303  BP: 136/82  Pulse: 89  Resp: 18  Temp: 98.2 F (36.8 C)  SpO2: 99%   Filed Weights   05/29/17 1303  Weight: 181 lb (82.1 kg)    GENERAL:alert, no distress and comfortable SKIN: no rashes or significant lesions EYES: normal, Conjunctiva are pink and non-injected, sclera clear OROPHARYNX:no thrush or ulcers  LYMPH:  no palpable cervical, supraclavicular, or axillary lymphadenopathy  LUNGS: clear to auscultation with normal breathing effort HEART: regular rate & rhythm and no murmurs and no lower extremity edema ABDOMEN:abdomen soft, non-tender and normal bowel sounds. No hepatomegaly  Musculoskeletal:no cyanosis of digits and no clubbing  NEURO: alert & oriented x 3 with fluent speech, no focal motor/sensory deficits BREASTS: s/p bilateral mastectomy, incisions are  well healed. No nodularity or abnormalities along incisions or chest wall. Mild left upper chest/axillary lymphedema   LABORATORY DATA:  I have reviewed the data as listed CBC Latest Ref Rng & Units 05/29/2017 11/26/2016 06/05/2016  WBC 3.9 - 10.3 K/uL 7.8 6.8 6.9  Hemoglobin 11.6 - 15.9 g/dL 13.0 13.4 14.0  Hematocrit 34.8 - 46.6 % 39.0 40.2 41.9  Platelets 145 - 400 K/uL 228 229 239     CMP Latest Ref Rng & Units 05/29/2017 11/26/2016 06/05/2016  Glucose 70 - 140 mg/dL 119 121 116  BUN 7 - 26 mg/dL 15 16.8 13.9  Creatinine 0.60 - 1.10 mg/dL 0.87 0.9 0.9  Sodium 136 - 145 mmol/L 140 140 144  Potassium 3.5 - 5.1 mmol/L 4.0 5.0 4.5  Chloride 98 - 109 mmol/L 101 - -  CO2 22 - 29 mmol/L 27 27 31(H)  Calcium 8.4 - 10.4 mg/dL 9.7 9.8 9.7  Total Protein 6.4 - 8.3 g/dL 7.1 7.3 7.1  Total Bilirubin 0.2 - 1.2 mg/dL 0.2 0.24 0.27  Alkaline Phos 40 - 150 U/L 105 94 94  AST 5 -  34 U/L 43(H) 30 31  ALT 0 - 55 U/L 55 41 43   PATHOLOGY REPORTS: Diagnosis 07/04/2015 Breast, left, needle core biopsy, lower outer quadrant - INVASIVE DUCTAL CARCINOMA. - DUCTAL CARCINOMA IN SITU. - SEE COMMENT. Microscopic Comment The carcinoma appears grade 1. A breast prognostic profile will be performed and the results reported separately. The results were called to the [facility] on 07/06/2015. (JBK:kh 07/06/15) Results: IMMUNOHISTOCHEMICAL AND MORPHOMETRIC ANALYSIS PERFORMED MANUALLY Estrogen Receptor: 100%, POSITIVE, STRONG STAINING INTENSITY Progesterone Receptor: 80%, POSITIVE, STRONG STAINING INTENSITY Proliferation Marker Ki67: 15%  Results: HER2 - NEGATIVE RATIO OF HER2/CEP17 SIGNALS 1.09 AVERAGE HER2 COPY NUMBER PER CELL 1.90   Diagnosis 08/24/2015 1. Breast, simple mastectomy, Right - FIBROCYSTIC CHANGES WITH ADENOSIS. - PSEUDOANGIOMATOUS STROMAL HYPERPLASIA ([place]). - USUAL DUCTAL HYPERPLASIA. - THERE IS NO EVIDENCE OF CARCINOMA IN 7 OF 7 LYMPH NODES (0/7). - THERE IS NO EVIDENCE  OF MALIGNANCY. 2. Breast, simple mastectomy, Left - INVASIVE DUCTAL CARCINOMA GRADE I/III, SPANNING 1.3 CM. - DUCTAL CARCINOMA IN SITU, LOW GRADE. - LYMPHOVASCULAR INVASION IS IDENTIFIED. - THERE IS NO EVIDENCE OF CARCINOMA IN 4 OF 4 LYMPH NODES (0/4). - THE SURGICAL RESECTION MARGINS ARE NEGATIVE FOR CARCINOMA. - SEE ONCOLOGY TABLE BELOW. 3. Lymph node, sentinel, biopsy, Left axillary - THERE IS NO EVIDENCE OF CARCINOMA IN 1 OF 1 LYMPH NODE (0/1). 4. Lymph node, sentinel, biopsy, Left axillary - THERE IS NO EVIDENCE OF CARCINOMA IN 1 OF 1 LYMPH NODE (0/1). 5. Lymph node, sentinel, biopsy, Left axillary - THERE IS NO EVIDENCE OF CARCINOMA IN 1 OF 1 LYMPH NODE (0/1). 6. Lymph node, sentinel, biopsy, Left axillary - THERE IS NO EVIDENCE OF CARCINOMA IN 1 OF 1 LYMPH NODE (0/1). 7. Lymph node, sentinel, biopsy, Left axillary - THERE IS NO EVIDENCE OF CARCINOMA IN 1 OF 1 LYMPH NODE (0/1). Microscopic Comment 2. BREAST, INVASIVE TUMOR, WITH LYMPH NODES PRESENT Specimen, including laterality and lymph node sampling (sentinel, non-sentinel): Left breast and left axillary lymph nodes. Procedure: Simple mastectomy and axillary lymph node resections. 1 of 4 FINAL for JAILEN, LUNG ([id number]) Microscopic Comment(continued) Histologic type: Ductal. Grade: 1. Tubule formation: 2. Nuclear pleomorphism: 2. Mitotic: 1. Tumor size (gross measurement): 1.3 cm. Margins: Greater than 0.2 cm to all margins. Lymphovascular invasion: Present. Ductal carcinoma in situ: Present. Grade: Low grade. Lobular neoplasia: Not identified. Tumor focality: Unifocal. Treatment effect: N/A. Extent of tumor: Confined to breast parenchyma. Lymph nodes: Examined: 5 Sentinel 4 Non-sentinel 9 Total Lymph nodes with metastasis: 0. Breast prognostic profile: [id number]. Estrogen receptor: 100%, strong. Progesterone receptor: 80%, strong. Her 2 neu: No amplification was detected. Ki-67:  15%. Non-neoplastic breast: No significant findings. TNM: pT1c, pN0. (JBK:ds 08/29/15)  ONCOTYPE DX RS: 8, which predicts 10-y distant recurrence risk of 6%    RADIOGRAPHIC STUDIES: I have personally reviewed the radiological images as listed and agreed with the findings in the report. No results found.   ASSESSMENT & PLAN: 52 y.o. premenopausal Caucasian woman  1. 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 Maltz is clinically doing well. She is nearly 2 years from her initial diagnosis. She tolerates tamoxifen well overall. Physical exam is unremarkable. CBC is normal, CMP normal except slightly elevated AST, 43. She is on statin, drinks occasional alcohol. Denies heavy tylenol use. No RUQ tenderness. Will monitor. No clinical concern for recurrence. She had bilateral mastectomy, no  role for mammogram. We again discussed signs of cancer recurrence. I recommend to continue surveillance.  -She has not had menses since starting tamoxifen 10/2015. She plans to see GYN for routine f/u in July. She can get [facility] and estradiol levels checked. If she is post-menopause, can discuss changing tamoxifen to AI at next f/u with Dr. Burr Medico.  -Lab, f/u with Dr. Burr Medico in 6 months   2. Insomnia  -takes melatonin qHS  3. Hot flash  -She has slightly increased hot flashes, but still tolerable overall. She does not want to start medication.   PLAN -continue breast cancer surveillance -continue tamoxifen daily, I refilled Rx today -FSH, estradiol with GYN 07/2017 at routine f/u -lab, f/u with Dr. Burr Medico in 6 months   All questions were answered. The patient knows to call the clinic with any problems, questions or concerns. No barriers to learning was detected.     Alla Feeling, NP 05/29/17

## 2017-08-07 IMAGING — MR MR BREAST BILAT WO/W CM
8 of 12 series · 32 of 48 positions shown · IV contrast (15 ml Multihance)
Comparison: Previous exam(s).

CLINICAL DATA: 50-year-old female with recently diagnosed invasive
ductal carcinoma of the left breast post stereotactic guided biopsy
of an area of distortion in the outer left breast 07/04/2015.
History of benign left breast excision in 0920.

LABS:  Not applicable
EXAM:
BILATERAL BREAST MRI WITH AND WITHOUT CONTRAST
TECHNIQUE: Multiplanar, multisequence MR images of both breasts were obtained
prior to and following the intravenous administration of 15 ml of
MultiHance.

[Series 2: t2_tirm_tra ipat (a-p) · axial · 3.0mm · 0.70mm/px · 1 of 55 slices shown]
[im 1/55]
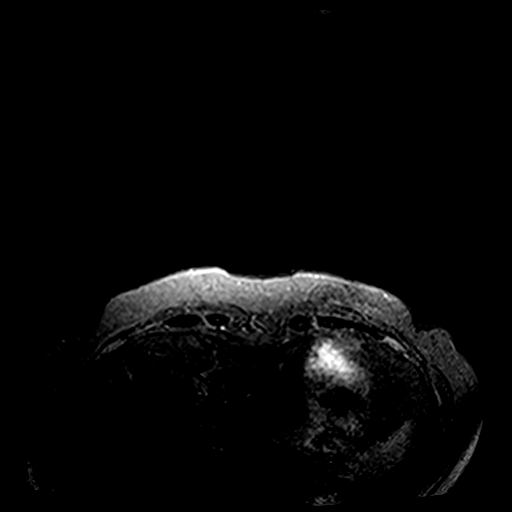

[Series 3: fl3d pre-cm no · axial · non-contrast · 1.2mm · 0.94mm/px · z∈[-105,+66]mm · 5 of 144 slices shown]
[im 1/144]
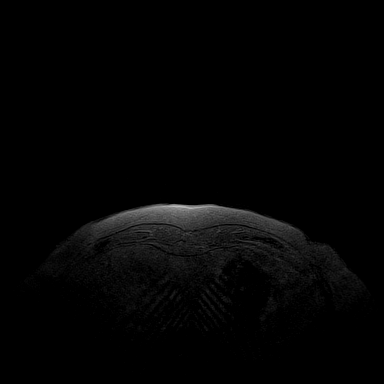
[im 36/144]
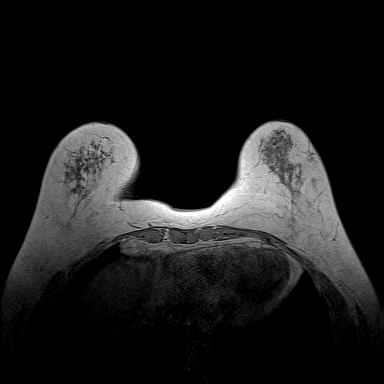
[im 72/144]
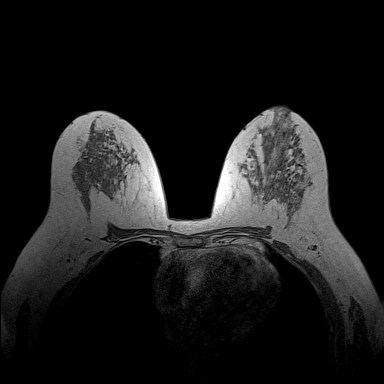
[im 108/144]
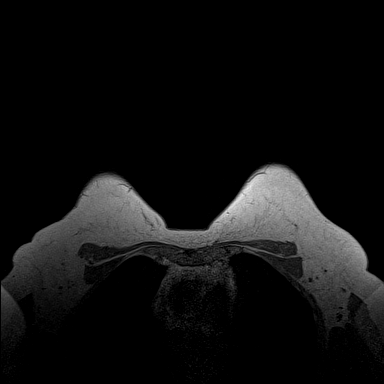
[im 144/144]
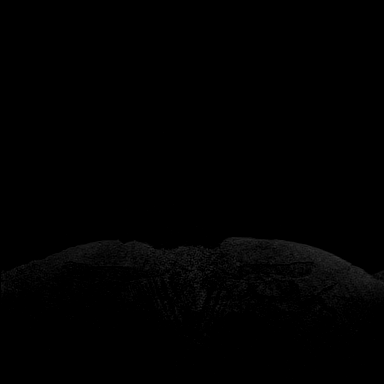

[Series 4: fl3d pre-cm · axial · non-contrast · 1.2mm · 0.94mm/px · z∈[-105,+66]mm · 5 of 144 slices shown]
[im 1/144]
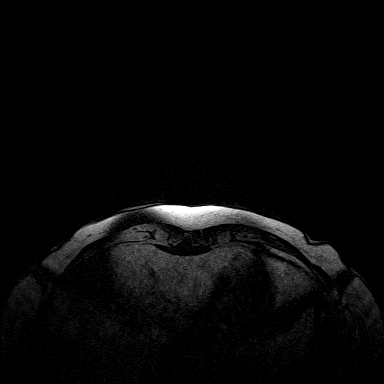
[im 36/144]
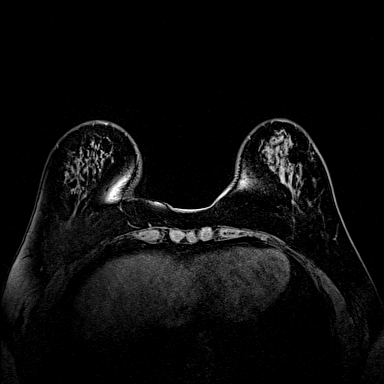
[im 72/144]
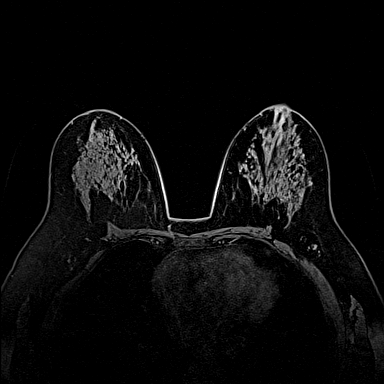
[im 108/144]
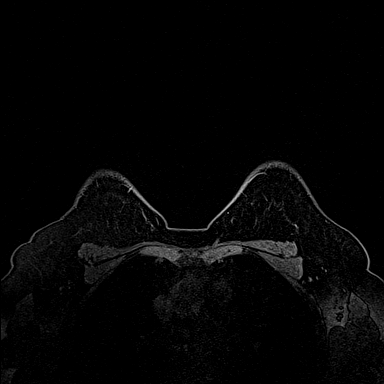
[im 144/144]
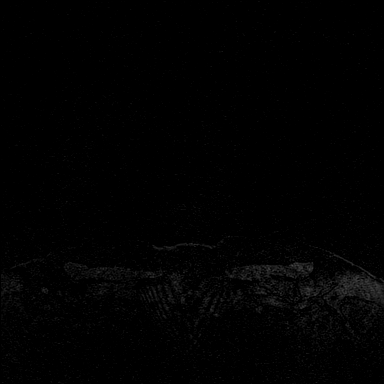

[Series 5: fl3d post-cm 20 · axial · 1.2mm · 0.94mm/px · z∈[-105,+66]mm · 5 of 144 slices shown (1 of 3)]
[im 1/144]
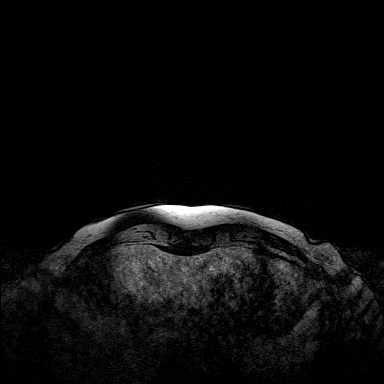
[im 36/144]
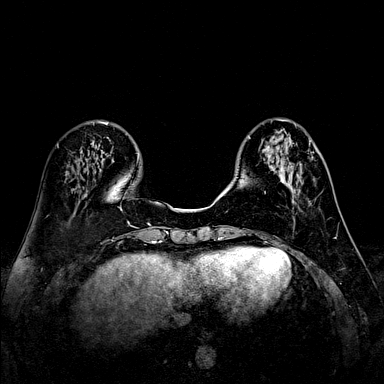
[im 72/144]
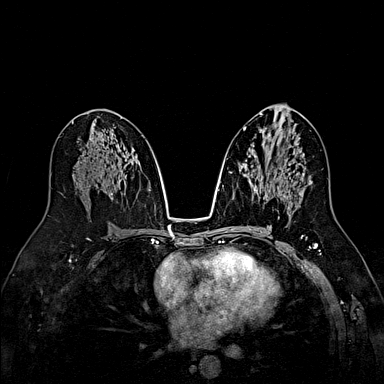
[im 108/144]
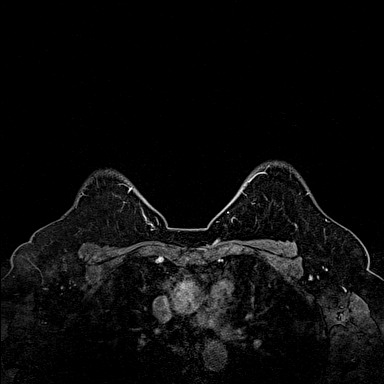
[im 144/144]
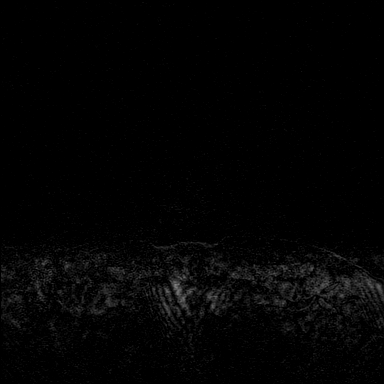

[Series 6: fl3d post-cm 20 · axial · 1.2mm · 0.94mm/px · z∈[-105,+66]mm · 5 of 144 slices shown (2 of 3)]
[im 1/144]
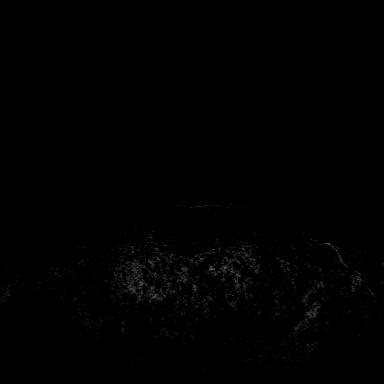
[im 36/144]
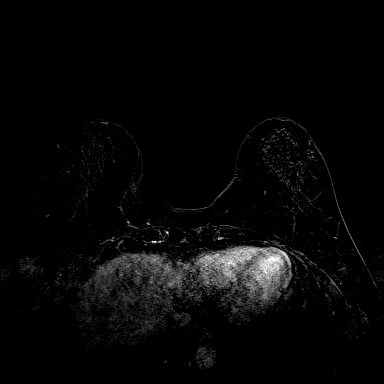
[im 72/144]
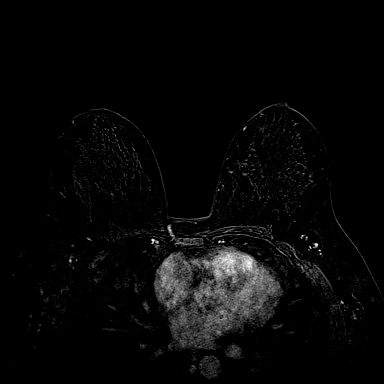
[im 108/144]
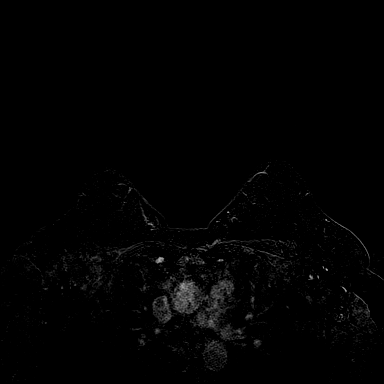
[im 144/144]
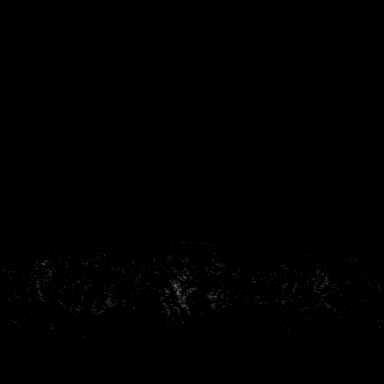

[Series 7: fl3d post-cm 20 · axial · 172.8mm · 0.94mm/px · 1 of 1 slices shown (3 of 3)]
[im 1/1]
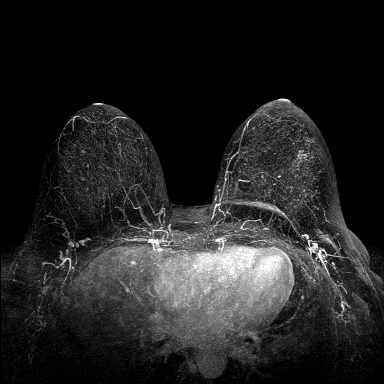

[Series 8: fl3d post-cm 3min · axial · 1.2mm · 0.94mm/px · z∈[-105,+66]mm · 6 of 144 slices shown]
[im 1/144]
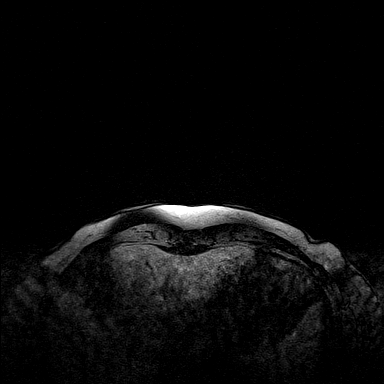
[im 29/144]
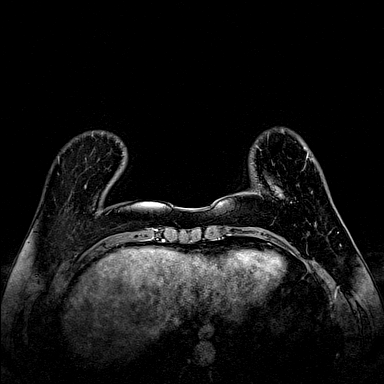
[im 58/144]
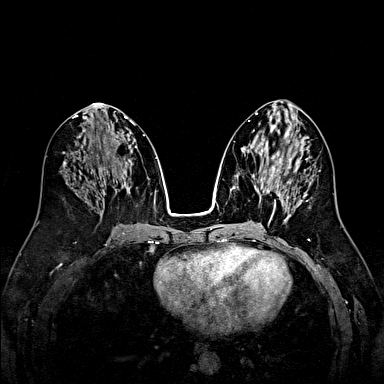
[im 86/144]
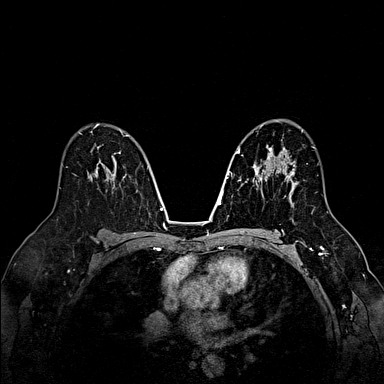
[im 115/144]
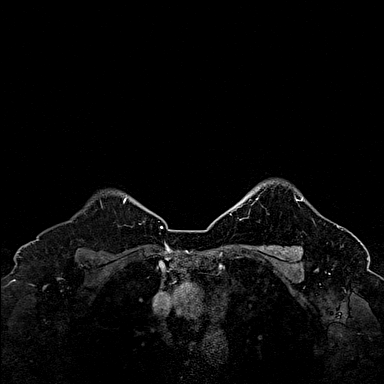
[im 144/144]
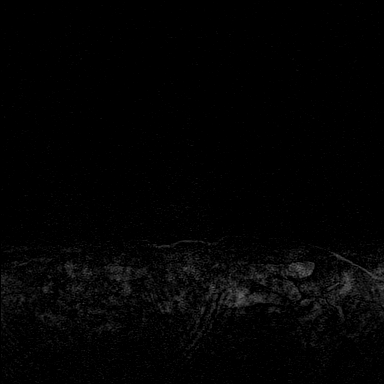

[Series 9: fl3d post-cm 3min_sub · axial · 1.2mm · 0.94mm/px · z∈[-105,-3]mm · 4 of 144 slices shown]
[im 1/144]
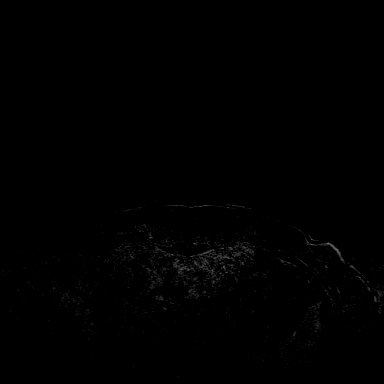
[im 29/144]
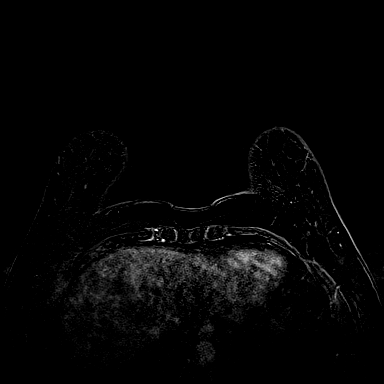
[im 58/144]
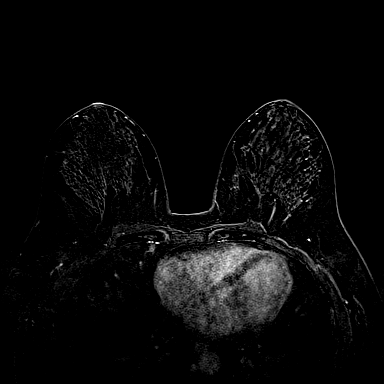
[im 86/144]
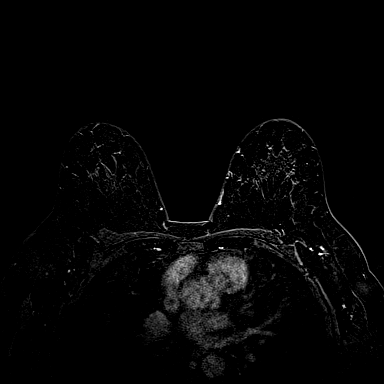

[32 of 48 positions shown; findings below may reference images not displayed]

THREE-DIMENSIONAL MR IMAGE RENDERING ON INDEPENDENT WORKSTATION:

Three-dimensional MR images were rendered by post-processing of the
original MR data on an independent workstation. The
three-dimensional MR images were interpreted, and findings are
reported in the following complete MRI report for this study. Three
dimensional images were evaluated at the independent [employer]
workstation
FINDINGS: Breast composition: c.  Heterogeneous fibroglandular tissue.

Background parenchymal enhancement: Moderate.

Right breast: No suspicious rapidly enhancing masses or abnormal
areas of enhancement seen in the right breast to suggest malignancy.

Left breast: Irregular enhancing mass with associated distortion in
the lower outer left breast at site of biopsy proven malignancy
measures 1 x 1 x 0.7 cm. Biopsy marker clip appears to be located
along the posterior medial margin of the biopsied malignancy. No
additional enhancing masses or abnormal areas of enhancement seen in
the left breast to suggest multifocal or multicentric disease.

Lymph nodes: No morphologically abnormal axillary lymph nodes. No
internal mammary lymphadenopathy.

Ancillary findings:  None.
IMPRESSION: 1. Biopsy proven malignancy in the lower outer left breast measures
1 x 1 x 0.7 cm. No findings to suggest multifocal or multicentric
disease.

2.  No MRI evidence of malignancy in the right breast.

RECOMMENDATION:
Treatment plan for known left breast malignancy.

BI-RADS CATEGORY  6: Known biopsy-proven malignancy.

## 2017-11-27 ENCOUNTER — Inpatient Hospital Stay: Payer: BLUE CROSS/BLUE SHIELD | Attending: Hematology | Admitting: Hematology

## 2017-11-27 ENCOUNTER — Inpatient Hospital Stay: Payer: BLUE CROSS/BLUE SHIELD

## 2017-11-27 ENCOUNTER — Telehealth: Payer: Self-pay

## 2017-11-27 VITALS — BP 149/78 | HR 81 | Temp 98.5°F | Resp 18 | Ht 63.0 in | Wt 177.2 lb

## 2017-11-27 DIAGNOSIS — Z79810 Long term (current) use of selective estrogen receptor modulators (SERMs): Secondary | ICD-10-CM

## 2017-11-27 DIAGNOSIS — C50512 Malignant neoplasm of lower-outer quadrant of left female breast: Secondary | ICD-10-CM

## 2017-11-27 DIAGNOSIS — Z9013 Acquired absence of bilateral breasts and nipples: Secondary | ICD-10-CM | POA: Diagnosis not present

## 2017-11-27 DIAGNOSIS — Z170 Estrogen receptor positive status [ER+]: Secondary | ICD-10-CM | POA: Diagnosis not present

## 2017-11-27 LAB — COMPREHENSIVE METABOLIC PANEL
ALT: 34 U/L (ref 0–44)
AST: 27 U/L (ref 15–41)
Albumin: 3.8 g/dL (ref 3.5–5.0)
Alkaline Phosphatase: 111 U/L (ref 38–126)
Anion gap: 8 (ref 5–15)
BUN: 13 mg/dL (ref 6–20)
CHLORIDE: 104 mmol/L (ref 98–111)
CO2: 28 mmol/L (ref 22–32)
Calcium: 9.2 mg/dL (ref 8.9–10.3)
Creatinine, Ser: 0.77 mg/dL (ref 0.44–1.00)
Glucose, Bld: 112 mg/dL — ABNORMAL HIGH (ref 70–99)
POTASSIUM: 4 mmol/L (ref 3.5–5.1)
SODIUM: 140 mmol/L (ref 135–145)
Total Bilirubin: 0.3 mg/dL (ref 0.3–1.2)
Total Protein: 7.1 g/dL (ref 6.5–8.1)

## 2017-11-27 LAB — CBC WITH DIFFERENTIAL/PLATELET
ABS IMMATURE GRANULOCYTES: 0.02 10*3/uL (ref 0.00–0.07)
BASOS ABS: 0 10*3/uL (ref 0.0–0.1)
Basophils Relative: 1 %
Eosinophils Absolute: 0.1 10*3/uL (ref 0.0–0.5)
Eosinophils Relative: 2 %
HCT: 37.8 % (ref 36.0–46.0)
HEMOGLOBIN: 12.7 g/dL (ref 12.0–15.0)
IMMATURE GRANULOCYTES: 0 %
LYMPHS PCT: 31 %
Lymphs Abs: 2.3 10*3/uL (ref 0.7–4.0)
MCH: 29.8 pg (ref 26.0–34.0)
MCHC: 33.6 g/dL (ref 30.0–36.0)
MCV: 88.7 fL (ref 80.0–100.0)
Monocytes Absolute: 0.5 10*3/uL (ref 0.1–1.0)
Monocytes Relative: 7 %
NEUTROS ABS: 4.4 10*3/uL (ref 1.7–7.7)
NEUTROS PCT: 59 %
NRBC: 0 % (ref 0.0–0.2)
Platelets: 236 10*3/uL (ref 150–400)
RBC: 4.26 MIL/uL (ref 3.87–5.11)
RDW: 12.3 % (ref 11.5–15.5)
WBC: 7.4 10*3/uL (ref 4.0–10.5)

## 2017-11-27 NOTE — Progress Notes (Signed)
Patricia Larson   Telephone:(336) [phone] Fax:(336) [phone]   Clinic Follow up Note   Patient Care Team: Patricia Kick, MD as PCP - General (Obstetrics and Gynecology) Patricia Bookbinder, MD as Consulting Physician (General Surgery)  Date of Service:  11/27/2017  CHIEF COMPLAINT: F/u of her left breast cancer  SUMMARY OF ONCOLOGIC HISTORY: Oncology History   Breast cancer of lower-outer quadrant of left female breast [facility])   Staging form: Breast, AJCC 7th Edition   - Clinical stage from 07/04/2015: Stage IA (T1b, N0, M0) - Signed by Patricia Merle, MD on 07/19/2015   - Pathologic stage from 08/24/2015: Stage IA (T1c, N0, cM0) - Signed by Patricia Merle, MD on 09/19/2015      Breast cancer of 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    07/15/2015 Imaging    breast MRI w wo contrast showed biopsy proven malignancy in the lower outer left breast measures 1 x 1 x 0.7 cm. No findings to suggest multifocal or multicentric disease. Right breast was negative.    08/24/2015 Surgery    Bilateral breast mastectomy and sentinel lymph node biopsy    08/24/2015 Pathology Results    Left breast mastectomy showed a 1.3 cm invasive ductal carcinoma, grade 1, low-grade DCIS, LVI (+), surgical margins were negative. 5 left axillary nodes were negative. Right breast mastectomy showed no malignancy, 7 lymph nodes negative.     08/24/2015 Oncotype testing      Recurrence score 8, which predicts 10 year risk of distant recurrence 6% with tamoxifen    10/02/2015 -  Anti-estrogen oral therapy    Adjuvant tamoxifen 20 mg once daily      CURRENT THERAPY:  Tamoxifen 64m daily since 10/2015  INTERVAL HISTORY:  Patricia VARONEis here for a follow up of her left breast cancer. She was last seen by me 1 year ago. In interim she was seen by NP Patricia Larson 6 months ago. She presents to the clinic today by herself. She notes she is doing well. She notes inner arm brown spots on her right arm. She has seen dermatologist who gave her topical cream with no resolution. She notes her BP is elevated at 149/78 today, but is usually normal. She notes she is trying to lose weight and has lost 4 pounds since her last visit. She joined [employer] 10/2017. She notes epigastric pain which she attributes to krill oil supplement.  She notes her last period which was already irregular was when she started Tamoxifen. She note she went to her Gynecologist and after testing she was likely post menopausal. She notes still having significant hot flashes but she managed it with Tamoxifen.       REVIEW OF SYSTEMS:   Constitutional: Denies fevers, chills (+) purposeful weight loss (+) significant hot flashes, manageable  Eyes: Denies blurriness of vision Ears, nose, mouth, throat, and face: Denies  mucositis or sore throat Respiratory: Denies cough, dyspnea or wheezes Cardiovascular: Denies palpitation, chest discomfort or lower extremity swelling Gastrointestinal:  Denies nausea, heartburn or change in bowel habits Skin: Denies abnormal skin rashes (+) brown spots on her inner upper arms Lymphatics: Denies new lymphadenopathy or easy bruising Neurological:Denies numbness, tingling or new weaknesses Behavioral/Psych: Mood is stable, no new changes  All other systems were reviewed with the patient and are negative.  MEDICAL HISTORY:  Past Medical History:  Diagnosis Date   . Cancer [facility]) 2017   left breast cancer  . Hyperlipidemia     SURGICAL HISTORY: Past Surgical History:  Procedure Laterality Date  . BREAST BIOPSY  06/28/10   left  . CHOLECYSTECTOMY    . MASTECTOMY W/ SENTINEL NODE BIOPSY Bilateral 08/24/2015   Procedure: BILATERAL TOTAL MASTECTOMY WITH LEFT SENTINEL LYMPH NODE BIOPSY;  Surgeon: Patricia Bookbinder, MD;  Location: [place];  Service: General;  Laterality: Bilateral;    I have reviewed the social history and family history with the patient and they are unchanged from previous note.  ALLERGIES:  has No Known Allergies.  MEDICATIONS:  Current Outpatient Medications  Medication Sig Dispense Refill  . celecoxib (CELEBREX) 200 MG capsule Take 200 mg by mouth as needed.     . Cholecalciferol (VITAMIN D3) 1000 units CAPS Take by mouth.    . Melatonin 10 MG TABS Take 10 mg by mouth at bedtime.    . Multiple Vitamin (MULTIVITAMIN PO) Take by mouth daily.      . Omega-3 Fatty Acids (FISH OIL) 1200 MG CAPS Take 1 capsule by mouth daily.    . simvastatin (ZOCOR) 20 MG tablet Take 20 mg by mouth daily.    . tamoxifen (NOLVADEX) 20 MG tablet Take 1 tablet (20 mg total) by mouth daily. 90 tablet 3   No current facility-administered medications for this visit.     PHYSICAL EXAMINATION: ECOG PERFORMANCE STATUS: 0 - Asymptomatic  Vitals:   11/27/17 1300  BP: (!) 149/78  Pulse: 81  Resp: 18  Temp: 98.5 F (36.9 C)  SpO2: 100%   Filed Weights   11/27/17 1300  Weight: 177 lb 3.2 oz (80.4 kg)    GENERAL:alert, no distress and comfortable SKIN: skin color, texture, turgor are normal, no rashes or significant lesions (+) 2-4cm flat hyperpigmented rash in upper medial arms EYES: normal, Conjunctiva are pink and non-injected, sclera clear OROPHARYNX:no exudate, no erythema and lips, buccal mucosa, and tongue normal  NECK: supple, thyroid normal size, non-tender, without nodularity LYMPH:  no palpable lymphadenopathy in  the cervical, axillary or inguinal LUNGS: clear to auscultation and percussion with normal breathing effort HEART: regular rate & rhythm and no murmurs and no lower extremity edema ABDOMEN:abdomen soft, non-tender and normal bowel sounds (+) mild epigastric tenderness  Musculoskeletal:no cyanosis of digits and no clubbing  NEURO: alert & oriented x 3 with fluent speech, no focal motor/sensory deficits BREAST: S/p b/l mastectomy: with mild scar tissue at incision, no palpable mass   LABORATORY DATA:  I have reviewed the data as listed CBC Latest Ref Rng & Units 11/27/2017 05/29/2017 11/26/2016  WBC 4.0 - 10.5 K/uL 7.4 7.8 6.8  Hemoglobin 12.0 - 15.0 g/dL 12.7 13.0 13.4  Hematocrit 36.0 - 46.0 % 37.8 39.0 40.2  Platelets 150 - 400 K/uL 236 228 229     CMP Latest Ref Rng & Units 11/27/2017 05/29/2017 11/26/2016  Glucose 70 - 99 mg/dL 112(H) 119 121  BUN 6 - 20 mg/dL  13 15 16.8  Creatinine 0.44 - 1.00 mg/dL 0.77 0.87 0.9  Sodium 135 - 145 mmol/L 140 140 140  Potassium 3.5 - 5.1 mmol/L 4.0 4.0 5.0  Chloride 98 - 111 mmol/L 104 101 -  CO2 22 - 32 mmol/L '28 27 27  ' Calcium 8.9 - 10.3 mg/dL 9.2 9.7 9.8  Total Protein 6.5 - 8.1 g/dL 7.1 7.1 7.3  Total Bilirubin 0.3 - 1.2 mg/dL 0.3 0.2 0.24  Alkaline Phos 38 - 126 U/L 111 105 94  AST 15 - 41 U/L 27 43(H) 30  ALT 0 - 44 U/L 34 55 41      RADIOGRAPHIC STUDIES: I have personally reviewed the radiological images as listed and agreed with the findings in the report. No results found.   ASSESSMENT & PLAN:  Patricia Larson is a 52 y.o. female with   1. 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 She was treated with bilateral mastectomy and currently on Tamoxifen since 10/2015. Tolerating moderately well with hot flashes. -She was seen by Gyn this year who tested her hormone levels. I will  request report of [place]eddes test. Will repeat next year if results are borderline.  -I discussed when she switches to AI if she is postmenopausal, she will continue treatment for another 5 years. If she continues on Tamoxifen she will complete 10 years. I reviewed her risk of recurrence with and without further treatment. -She is clinically doing well. Lab reviewed, her CBC and CMP are within normal limits. Her physical exam was unremarkable, except a a small rash of her upper medial arms. I do not suspect this is related to her cancer, possibly eczema. Continue to follow up with dermatologist. There is no clinical concern for recurrence. -Continue Tamoxifen -F/u in 6 months    2. Insomnia  -She is to take Benadryl daily at night for insomnia as needed  -She takes Melatonin every night -Not mentioned today in visit, likely resolved.   3. Hot flush -Significant and secondary to tamoxifen, but overall manageable  Plan -Continue Tamoxifen  -Labs and f/u in 6 months, will check [facility] on her next visit.   No problem-specific Assessment & Plan notes found for this encounter.   No orders of the defined types were placed in this encounter.  All questions were answered. The patient knows to call the clinic with any problems, questions or concerns. No barriers to learning was detected. I spent 15 minutes counseling the patient face to face. The total time spent in the appointment was 20 minutes and more than 50% was on counseling and review of test results     Patricia Merle, MD 11/27/2017   I, Joslyn Devon, am acting as scribe for Patricia Merle, MD.   I have reviewed the above documentation for accuracy and completeness, and I agree with the above.

## 2017-11-27 NOTE — Telephone Encounter (Signed)
Printed avs and calender of upcoming appointment. Per 11/27 los 

## 2017-11-29 ENCOUNTER — Encounter: Payer: Self-pay | Admitting: Hematology

## 2018-04-21 ENCOUNTER — Telehealth: Payer: Self-pay | Admitting: Hematology

## 2018-04-21 NOTE — Telephone Encounter (Signed)
Please offer her a phone or webex visit for May if she is interested, and keep her Sep appointment, thanks   Truitt Merle MD

## 2018-04-21 NOTE — Telephone Encounter (Signed)
Returned patient's phone call regarding rescheduling an appointment, due to concerns of COVID-19 per patient's request the appointment has been moved from May to September.   Message to provider.

## 2018-05-21 ENCOUNTER — Other Ambulatory Visit: Payer: BLUE CROSS/BLUE SHIELD

## 2018-05-28 ENCOUNTER — Ambulatory Visit: Payer: BLUE CROSS/BLUE SHIELD | Admitting: Hematology

## 2018-07-10 ENCOUNTER — Other Ambulatory Visit: Payer: Self-pay | Admitting: Hematology

## 2018-07-10 DIAGNOSIS — C50512 Malignant neoplasm of lower-outer quadrant of left female breast: Secondary | ICD-10-CM

## 2018-07-10 DIAGNOSIS — Z170 Estrogen receptor positive status [ER+]: Secondary | ICD-10-CM

## 2018-08-26 ENCOUNTER — Telehealth: Payer: Self-pay | Admitting: Hematology

## 2018-08-26 NOTE — Telephone Encounter (Signed)
Called pt per 8/25 sch message - no answer - left message for patient to call back to reschedule appt.   

## 2018-09-17 ENCOUNTER — Inpatient Hospital Stay: Payer: BC Managed Care – PPO | Attending: Hematology

## 2018-09-17 ENCOUNTER — Other Ambulatory Visit: Payer: Self-pay

## 2018-09-17 DIAGNOSIS — Z9013 Acquired absence of bilateral breasts and nipples: Secondary | ICD-10-CM | POA: Insufficient documentation

## 2018-09-17 DIAGNOSIS — C50512 Malignant neoplasm of lower-outer quadrant of left female breast: Secondary | ICD-10-CM | POA: Diagnosis not present

## 2018-09-17 DIAGNOSIS — Z23 Encounter for immunization: Secondary | ICD-10-CM | POA: Diagnosis not present

## 2018-09-17 DIAGNOSIS — Z79899 Other long term (current) drug therapy: Secondary | ICD-10-CM | POA: Insufficient documentation

## 2018-09-17 DIAGNOSIS — Z79811 Long term (current) use of aromatase inhibitors: Secondary | ICD-10-CM | POA: Diagnosis not present

## 2018-09-17 DIAGNOSIS — Z170 Estrogen receptor positive status [ER+]: Secondary | ICD-10-CM | POA: Diagnosis not present

## 2018-09-17 LAB — CBC WITH DIFFERENTIAL/PLATELET
Abs Immature Granulocytes: 0.02 10*3/uL (ref 0.00–0.07)
Basophils Absolute: 0.1 10*3/uL (ref 0.0–0.1)
Basophils Relative: 1 %
Eosinophils Absolute: 0.2 10*3/uL (ref 0.0–0.5)
Eosinophils Relative: 3 %
HCT: 40.2 % (ref 36.0–46.0)
Hemoglobin: 13.1 g/dL (ref 12.0–15.0)
Immature Granulocytes: 0 %
Lymphocytes Relative: 32 %
Lymphs Abs: 2.2 10*3/uL (ref 0.7–4.0)
MCH: 29.8 pg (ref 26.0–34.0)
MCHC: 32.6 g/dL (ref 30.0–36.0)
MCV: 91.4 fL (ref 80.0–100.0)
Monocytes Absolute: 0.4 10*3/uL (ref 0.1–1.0)
Monocytes Relative: 5 %
Neutro Abs: 3.9 10*3/uL (ref 1.7–7.7)
Neutrophils Relative %: 59 %
Platelets: 251 10*3/uL (ref 150–400)
RBC: 4.4 MIL/uL (ref 3.87–5.11)
RDW: 12.3 % (ref 11.5–15.5)
WBC: 6.7 10*3/uL (ref 4.0–10.5)
nRBC: 0 % (ref 0.0–0.2)

## 2018-09-17 LAB — COMPREHENSIVE METABOLIC PANEL
ALT: 20 U/L (ref 0–44)
AST: 21 U/L (ref 15–41)
Albumin: 4.1 g/dL (ref 3.5–5.0)
Alkaline Phosphatase: 95 U/L (ref 38–126)
Anion gap: 8 (ref 5–15)
BUN: 11 mg/dL (ref 6–20)
CO2: 31 mmol/L (ref 22–32)
Calcium: 9.3 mg/dL (ref 8.9–10.3)
Chloride: 102 mmol/L (ref 98–111)
Creatinine, Ser: 0.78 mg/dL (ref 0.44–1.00)
GFR calc Af Amer: >60 mL/min (ref >60)
GFR calc non Af Amer: >60 mL/min (ref >60)
Glucose, Bld: 97 mg/dL (ref 70–99)
Potassium: 4.1 mmol/L (ref 3.5–5.1)
Sodium: 141 mmol/L (ref 135–145)
Total Bilirubin: 0.3 mg/dL (ref 0.3–1.2)
Total Protein: 7.2 g/dL (ref 6.5–8.1)

## 2018-09-18 LAB — FOLLICLE STIMULATING HORMONE: FSH: 39.4 m[IU]/mL

## 2018-09-18 LAB — ESTRADIOL: Estradiol: <5 pg/mL

## 2018-09-24 ENCOUNTER — Ambulatory Visit: Payer: BLUE CROSS/BLUE SHIELD | Admitting: Hematology

## 2018-09-26 ENCOUNTER — Telehealth: Payer: Self-pay | Admitting: Hematology

## 2018-09-26 NOTE — Telephone Encounter (Signed)
Changed time of 9/29 f/u per 9/25 schedule message. Confirmed with patient.

## 2018-09-26 NOTE — Progress Notes (Signed)
[place]   Telephone:(336) [phone] Fax:(336) [phone]   Clinic Follow up Note   Patient Care Team: Vanessa Kick, MD as PCP - General (Obstetrics and Gynecology) Rolm Bookbinder, MD as Consulting Physician (General Surgery)  Date of Service:  09/30/2018  CHIEF COMPLAINT: F/u of her left breast cancer  SUMMARY OF ONCOLOGIC HISTORY: Oncology History Overview Note  Breast cancer of lower-outer quadrant of left female breast [facility])   Staging form: Breast, AJCC 7th Edition   - Clinical stage from 07/04/2015: Stage IA (T1b, N0, M0) - Signed by Truitt Merle, MD on 07/19/2015   - Pathologic stage from 08/24/2015: Stage IA (T1c, N0, cM0) - Signed by Truitt Merle, MD on 09/19/2015    Breast cancer of lower-outer quadrant of left female breast ([place])  07/04/2015 Initial Diagnosis   Breast cancer of lower-outer quadrant of left female breast ([place])   07/04/2015 Receptors her2   Your 100% positive, PR 80% positive, HER-2 negative, Ki-67 15%   07/04/2015 Initial Biopsy   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   07/15/2015 Imaging   breast MRI w wo contrast showed biopsy proven malignancy in the lower outer left breast measures 1 x 1 x 0.7 cm. No findings to suggest multifocal or multicentric disease. Right breast was negative.   08/24/2015 Surgery   Bilateral breast mastectomy and sentinel lymph node biopsy   08/24/2015 Pathology Results   Left breast mastectomy showed a 1.3 cm invasive ductal carcinoma, grade 1, low-grade DCIS, LVI (+), surgical margins were negative. 5 left axillary nodes were negative. Right breast mastectomy showed no malignancy, 7 lymph nodes negative.    08/24/2015 Oncotype testing   Recurrence score 8, which predicts 10 year risk of distant recurrence 6% with tamoxifen   10/02/2015 -  Anti-estrogen oral therapy   Adjuvant tamoxifen 20 mg once daily. Switched to anastrozole on 09/30/18 since postmenopausal.       CURRENT THERAPY:  Tamoxifen '20mg'$  daily since 10/2015. Switched to anastrozole on 09/30/18 since postmenopausal.   INTERVAL HISTORY:  Patricia Larson is here for a follow up left breast cancer. She was last seen by me 10 months ago. She presents to the clinic alone. She notes she is doing well. She notes her last [period was in 10/2015 when she started 2017. She notes her period before then was getting lighter since 2015 and was becoming irregular. She notes occasional back pain and left axillary swelling. She massages and walks for this. She notes she has been apart of weight loss program and has been able to lose over 20 pounds.  She notes she has high deductible insurance so labs, visits and tests cost in $500 range for her. She see her Gyn yearly with labs in October. She has not seen by PCP in over a year.  She notes when she wears her bra prosthesis she has some left axillary swelling and it may irritate her back.   REVIEW OF SYSTEMS:   Constitutional: Denies fevers, chills (+) Purposeful weight loss Eyes: Denies blurriness of vision Ears, nose, mouth, throat, and face: Denies mucositis or sore throat Respiratory: Denies cough, dyspnea or wheezes Cardiovascular: Denies palpitation, chest discomfort or lower extremity swelling Gastrointestinal:  Denies  nausea, heartburn or change in bowel habits Skin: Denies abnormal skin rashes MSK: (+) Occasional back pain Lymphatics: Denies new lymphadenopathy or easy bruising Neurological:Denies numbness, tingling or new weaknesses Behavioral/Psych: Mood is stable, no new changes  All other systems were reviewed with the patient and are negative.  MEDICAL HISTORY:  Past Medical History:  Diagnosis Date  . Cancer [facility])  2017   left breast cancer  . Hyperlipidemia     SURGICAL HISTORY: Past Surgical History:  Procedure Laterality Date  . BREAST BIOPSY  06/28/10   left  . CHOLECYSTECTOMY    . MASTECTOMY W/ SENTINEL NODE BIOPSY Bilateral 08/24/2015   Procedure: BILATERAL TOTAL MASTECTOMY WITH LEFT SENTINEL LYMPH NODE BIOPSY;  Surgeon: Rolm Bookbinder, MD;  Location: [place];  Service: General;  Laterality: Bilateral;    I have reviewed the social history and family history with the patient and they are unchanged from previous note.  ALLERGIES:  has No Known Allergies.  MEDICATIONS:  Current Outpatient Medications  Medication Sig Dispense Refill  . celecoxib (CELEBREX) 200 MG capsule Take 200 mg by mouth as needed.     . Cholecalciferol (VITAMIN D3) 1000 units CAPS Take by mouth.    . DENTA 5000 PLUS 1.1 % CREA dental cream Take 51 g by mouth 2 (two) times daily.    . Melatonin 10 MG TABS Take 10 mg by mouth at bedtime.    . Multiple Vitamin (MULTIVITAMIN PO) Take by mouth daily.      . Omega-3 Fatty Acids (FISH OIL) 1200 MG CAPS Take 1 capsule by mouth daily.    . simvastatin (ZOCOR) 20 MG tablet Take 20 mg by mouth daily.    . tamoxifen (NOLVADEX) 20 MG tablet TAKE 1 TABLET BY MOUTH     DAILY 90 tablet 0  . anastrozole (ARIMIDEX) 1 MG tablet Take 1 tablet (1 mg total) by mouth daily. 30 tablet 4   No current facility-administered medications for this visit.     PHYSICAL EXAMINATION: ECOG PERFORMANCE STATUS: 0 - Asymptomatic  Vitals:   09/30/18 1219  BP: 128/73  Pulse: 80  Resp: 18  Temp: 98.7 F (37.1 C)  SpO2: 96%   Filed Weights   09/30/18 1219  Weight: 155 lb 14.4 oz (70.7 kg)    GENERAL:alert, no distress and comfortable SKIN: skin color, texture, turgor are normal, no rashes or significant lesions EYES: normal, Conjunctiva are pink and non-injected, sclera clear  NECK: supple, thyroid normal size, non-tender, without nodularity LYMPH:  no palpable  lymphadenopathy in the cervical, axillary  LUNGS: clear to auscultation and percussion with normal breathing effort HEART: regular rate & rhythm and no murmurs and no lower extremity edema ABDOMEN:abdomen soft, non-tender and normal bowel sounds Musculoskeletal:no cyanosis of digits and no clubbing  NEURO: alert & oriented x 3 with fluent speech, no focal motor/sensory deficits BREAST: S/p b/l mastectomies: Surgical incision healed well with mild scar tissue. No palpable mass, nodules or adenopathy bilaterally. Breast exam benign.   LABORATORY DATA:  I have reviewed the data as listed CBC Latest Ref Rng & Units 09/17/2018 11/27/2017 05/29/2017  WBC 4.0 - 10.5 K/uL 6.7 7.4 7.8  Hemoglobin 12.0 - 15.0 g/dL 13.1 12.7 13.0  Hematocrit 36.0 - 46.0 % 40.2 37.8 39.0  Platelets 150 - 400 K/uL 251 236 228     CMP Latest Ref Rng & Units 09/17/2018 11/27/2017 05/29/2017  Glucose 70 - 99 mg/dL 97 112(H) 119  BUN 6 - 20 mg/dL 11 13  15  Creatinine 0.44 - 1.00 mg/dL 0.78 0.77 0.87  Sodium 135 - 145 mmol/L 141 140 140  Potassium 3.5 - 5.1 mmol/L 4.1 4.0 4.0  Chloride 98 - 111 mmol/L 102 104 101  CO2 22 - 32 mmol/L '31 28 27  '$ Calcium 8.9 - 10.3 mg/dL 9.3 9.2 9.7  Total Protein 6.5 - 8.1 g/dL 7.2 7.1 7.1  Total Bilirubin 0.3 - 1.2 mg/dL 0.3 0.3 0.2  Alkaline Phos 38 - 126 U/L 95 111 105  AST 15 - 41 U/L 21 27 43(H)  ALT 0 - 44 U/L 20 34 55      RADIOGRAPHIC STUDIES: I have personally reviewed the radiological images as listed and agreed with the findings in the report. No results found.   ASSESSMENT & PLAN:  Patricia Larson is a 53 y.o. female with   1. 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 She was treated with bilateral mastectomy and currently on Tamoxifen since 10/2015. Tolerating moderately well with hot flashes. -She has axillary  swelling from wearing her prosthetic bra.  -Her last period was in 10/2015 when she started Tamoxifen. Her period became irregular in 2016. Based on her 09/17/18 labs her [facility] is 39 and Estradial levels <5 which indicating she is postmenopausal.  -I discussed she can switch to AI. The potential benefit and side effects, which includes but not limited to, hot flash, skin and vaginal dryness, metabolic changes ( increased blood glucose, cholesterol, weight, etc.), slightly in increased risk of cardiovascular disease, cataracts, muscular and joint discomfort, osteopenia and osteoporosis, etc, were discussed with her in great details.  . She is interested. I will call in Anastrozole today.  -She is clinically doing well. Lab reviewed from this month show her CBC and CMP are within normal limits. Her physical exam was unremarkable. There is no clinical concern for recurrence. -Continue surveillance. She had b/l mastectomy so she does not need mammograms.  -F/u in 7 months then yearly, due to high copay for her visit with [place]. She will see her Gyn in interim.  -She opted to receive flu shot today  3. Hot flush  -Significant and secondary to tamoxifen, but overall manageable -will watch on Anastrozole  4. Bone health  -Her last DEXA was 8 years ago and normal.  -I discussed AI can reduce her bone density.  -I would recommend new baseline DEXA but given high deductible, she declined for now.   5. Intentional weight Loss -She notes she has participated weight loss program and has been able to lose over 20 pounds. -I encouraged her to continue eat healthy and exercise.   Plan -flu shot today  -Tamoxifen will be switched to Anastrozole today, I call in  -Labs and f/u in 7 months, then yearly after next visit  -phone visit in 3 months   No problem-specific Assessment & Plan notes found for this encounter.   No orders of the defined types were placed in this encounter.  All questions were  answered. The patient knows to call the clinic with any problems, questions or concerns. No barriers to learning was detected. I spent 20 minutes counseling the patient face to face. The total time spent in the appointment was 25 minutes and more than 50% was on counseling and review of test results     Truitt Merle, MD 09/30/2018   I, Rollene Rotunda  Richardson Landry, am acting as scribe for Truitt Merle, MD.   I have reviewed the above documentation for accuracy and completeness, and I agree with the above.

## 2018-09-30 ENCOUNTER — Inpatient Hospital Stay (HOSPITAL_BASED_OUTPATIENT_CLINIC_OR_DEPARTMENT_OTHER): Payer: BC Managed Care – PPO | Admitting: Hematology

## 2018-09-30 ENCOUNTER — Other Ambulatory Visit: Payer: Self-pay

## 2018-09-30 ENCOUNTER — Telehealth: Payer: Self-pay | Admitting: Hematology

## 2018-09-30 ENCOUNTER — Encounter: Payer: Self-pay | Admitting: Hematology

## 2018-09-30 VITALS — BP 128/73 | HR 80 | Temp 98.7°F | Resp 18 | Ht 63.0 in | Wt 155.9 lb

## 2018-09-30 DIAGNOSIS — C50512 Malignant neoplasm of lower-outer quadrant of left female breast: Secondary | ICD-10-CM | POA: Diagnosis not present

## 2018-09-30 DIAGNOSIS — Z170 Estrogen receptor positive status [ER+]: Secondary | ICD-10-CM

## 2018-09-30 DIAGNOSIS — Z23 Encounter for immunization: Secondary | ICD-10-CM

## 2018-09-30 MED ORDER — ASPIRIN EC 81 MG PO TBEC: 81.0000 mg | DELAYED_RELEASE_TABLET | Freq: Every day | ORAL | 3 refills | Status: DC

## 2018-09-30 MED ORDER — INFLUENZA VAC SPLIT QUAD 0.5 ML IM SUSY
PREFILLED_SYRINGE | INTRAMUSCULAR | Status: AC
  Filled 2018-09-30: qty 0.5

## 2018-09-30 MED ORDER — ANASTROZOLE 1 MG PO TABS: 1.0000 mg | ORAL_TABLET | Freq: Every day | ORAL | 4 refills | Status: DC

## 2018-09-30 MED ORDER — ANASTROZOLE 1 MG PO TABS: 1.0000 mg | ORAL_TABLET | Freq: Every day | ORAL | 2 refills | Status: DC

## 2018-09-30 MED ORDER — INFLUENZA VAC SPLIT QUAD 0.5 ML IM SUSY
0.5000 mL | PREFILLED_SYRINGE | Freq: Once | INTRAMUSCULAR | Status: AC
  Administered 2018-09-30: 13:00:00 0.5 mL via INTRAMUSCULAR

## 2018-09-30 NOTE — Telephone Encounter (Signed)
Scheduled appt per 9/29 los.  Spoke with pt and she is aware of the appt date and time.

## 2018-10-01 ENCOUNTER — Other Ambulatory Visit: Payer: Self-pay | Admitting: Hematology

## 2018-10-01 DIAGNOSIS — C50512 Malignant neoplasm of lower-outer quadrant of left female breast: Secondary | ICD-10-CM

## 2018-12-29 ENCOUNTER — Ambulatory Visit: Payer: BC Managed Care – PPO | Admitting: Hematology

## 2019-04-27 NOTE — Progress Notes (Signed)
[place]   [phone] [phone]) [phone]   Clinic Follow up Note   Patient Care Team: Vanessa Kick, MD as PCP - General (Obstetrics and Gynecology) Rolm Bookbinder, MD as Consulting Physician (General Surgery)  Date of Service:  04/29/2019  CHIEF COMPLAINT: F/u ofher left breast cancer  SUMMARY OF ONCOLOGIC HISTORY: Oncology History Overview Note  Breast cancer of lower-outer quadrant of left female breast [facility])   Staging form: Breast, AJCC 7th Edition   - Clinical stage from 07/04/2015: Stage IA (T1b, N0, M0) - Signed by Truitt Merle, MD on 07/19/2015   - Pathologic stage from 08/24/2015: Stage IA (T1c, N0, cM0) - Signed by Truitt Merle, MD on 09/19/2015    Breast cancer of lower-outer quadrant of left female breast ([name])  07/04/2015 Initial Diagnosis   Breast cancer of lower-outer quadrant of left female breast ([name])   07/04/2015 Receptors her2   Your 100% positive, PR 80% positive, HER-2 negative, Ki-67 15%   07/04/2015 Initial Biopsy   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   07/15/2015 Imaging   breast MRI w wo contrast showed biopsy proven malignancy in the lower outer left breast measures 1 x 1 x 0.7 cm. No findings to suggest multifocal or multicentric disease. Right breast was negative.   08/24/2015 Surgery   Bilateral breast mastectomy and sentinel lymph node biopsy   08/24/2015 Pathology Results   Left breast mastectomy showed a 1.3 cm invasive ductal carcinoma, grade 1, low-grade DCIS, LVI (+), surgical margins were negative. 5 left axillary nodes were negative. Right breast mastectomy showed no malignancy, 7 lymph nodes negative.    08/24/2015 Oncotype testing   Recurrence score 8, which predicts 10 year risk of distant recurrence 6% with tamoxifen   10/02/2015 -  Anti-estrogen oral therapy   Adjuvant tamoxifen 20 mg once daily. Switched to anastrozole on 09/30/18 since postmenopausal.       CURRENT THERAPY:  Tamoxifen 42m daily since 10/2015. Switched to anastrozole on 09/30/18 since postmenopausal.   INTERVAL HISTORY:  Patricia FAHMYis here for a follow up of left breast cancer. She was last seen by me 7 months ago. She presents to the clinic alone. She notes she is doing well. She has been tolerating Anastrozole better than Tamoxifen with less hot flashes. She does have more joint pain but manageable with vibration machine.  She uses benadryl and Melatonin to help her sleep. She notes trying to lose weight. She has been fluctuating. She plans to see her Gyn in 09/2019. She notes her labs and visit here is about $500 out of pocket because her insurance will not cover anything until she meets a $1500 deductible each year.    REVIEW OF SYSTEMS:   Constitutional: Denies fevers, chills or abnormal weight loss (+) Improved hot flashes  Eyes: Denies blurriness of vision Ears, nose, mouth, throat, and face: Denies mucositis or sore throat Respiratory: Denies cough, dyspnea or wheezes Cardiovascular: Denies palpitation, chest discomfort or lower extremity swelling Gastrointestinal:  Denies nausea, heartburn or change in bowel habits Skin: Denies abnormal skin rashes MSK: (+) Joint aches and stiffness, mainly in hands.  Lymphatics: Denies new lymphadenopathy or easy bruising Neurological:Denies numbness, tingling or new weaknesses  Behavioral/Psych: Mood is stable, no new changes  All other systems were reviewed with the patient and are negative.  MEDICAL HISTORY:  Past Medical History:  Diagnosis Date  . Cancer [facility]) 2017   left breast cancer  . Hyperlipidemia     SURGICAL HISTORY: Past Surgical History:  Procedure Laterality Date  . BREAST  BIOPSY  06/28/10   left  . CHOLECYSTECTOMY    . MASTECTOMY W/ SENTINEL NODE BIOPSY Bilateral 08/24/2015   Procedure: BILATERAL TOTAL MASTECTOMY WITH LEFT SENTINEL LYMPH NODE BIOPSY;  Surgeon: Rolm Bookbinder, MD;  Location: [place];  Service: General;  Laterality: Bilateral;    I have reviewed the social history and family history with the patient and they are unchanged from previous note.  ALLERGIES:  has No Known Allergies.  MEDICATIONS:  Current Outpatient Medications  Medication Sig Dispense Refill  . anastrozole (ARIMIDEX) 1 MG tablet Take 1 tablet (1 mg total) by mouth daily. 90 tablet 2  . aspirin EC 81 MG tablet Take 1 tablet (81 mg total) by mouth daily. 90 tablet 3  . celecoxib (CELEBREX) 200 MG capsule Take 200 mg by mouth as needed.     . Cholecalciferol (VITAMIN D3) 1000 units CAPS Take by mouth.    . DENTA 5000 PLUS 1.1 % CREA dental cream Take 51 g by mouth 2 (two) times daily.    . diphenhydrAMINE (SOMINEX) 25 MG tablet Take by mouth.    . Melatonin 10 MG TABS Take 10 mg by mouth at bedtime.    . Multiple Vitamin (MULTIVITAMIN PO) Take by mouth daily.      . Omega-3 Fatty Acids (FISH OIL) 1200 MG CAPS Take 1 capsule by mouth daily.    . simvastatin (ZOCOR) 20 MG tablet Take 20 mg by mouth daily.     No current facility-administered medications for this visit.    PHYSICAL EXAMINATION: ECOG PERFORMANCE STATUS: 0 - Asymptomatic  Vitals:   04/29/19 1122  BP: 122/74  Pulse: 79  Resp: 18  Temp: 98.5 F (36.9 C)  SpO2: 98%   Filed Weights   04/29/19 1122  Weight: 157 lb 1.6 oz (71.3 kg)    GENERAL:alert, no distress and comfortable SKIN: skin color, texture, turgor are normal, no rashes or significant lesions EYES: normal, Conjunctiva are pink and non-injected, sclera clear  NECK: supple, thyroid normal size, non-tender, without nodularity LYMPH:  no palpable lymphadenopathy in the cervical, axillary  LUNGS: clear to auscultation and  percussion with normal breathing effort HEART: regular rate & rhythm and no murmurs and no lower extremity edema ABDOMEN:abdomen soft, non-tender and normal bowel sounds Musculoskeletal:no cyanosis of digits and no clubbing  NEURO: alert & oriented x 3 with fluent speech, no focal motor/sensory deficits BREAST: S/p b/l mastectomy with breasts surgically absent and surgical incisions healed well. No palpable mass, nodules. Mild b/l edema of arms. Breast exam benign.   LABORATORY DATA:  I have reviewed the data as listed CBC Latest Ref Rng & Units 04/29/2019 09/17/2018 11/27/2017  WBC 4.0 - 10.5 K/uL 5.9 6.7 7.4  Hemoglobin 12.0 - 15.0 g/dL 13.2 13.1 12.7  Hematocrit 36.0 - 46.0 % 41.2 40.2 37.8  Platelets 150 - 400 K/uL 256 251 236     CMP Latest Ref Rng & Units 09/17/2018 11/27/2017 05/29/2017  Glucose 70 - 99 mg/dL 97 112(H) 119  BUN 6 - 20 mg/dL '11 13 15  ' Creatinine 0.44 - 1.00 mg/dL 0.78 0.77 0.87  Sodium 135 - 145 mmol/L 141  140 140  Potassium 3.5 - 5.1 mmol/L 4.1 4.0 4.0  Chloride 98 - 111 mmol/L 102 104 101  CO2 22 - 32 mmol/L '31 28 27  ' Calcium 8.9 - 10.3 mg/dL 9.3 9.2 9.7  Total Protein 6.5 - 8.1 g/dL 7.2 7.1 7.1  Total Bilirubin 0.3 - 1.2 mg/dL 0.3 0.3 0.2  Alkaline Phos 38 - 126 U/L 95 111 105  AST 15 - 41 U/L 21 27 43(H)  ALT 0 - 44 U/L 20 34 55      RADIOGRAPHIC STUDIES: I have personally reviewed the radiological images as listed and agreed with the findings in the report. No results found.   ASSESSMENT & PLAN:  Patricia Larson is a 54 y.o. female with    1. 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 She was treated with bilateral mastectomy and currently on Tamoxifen since 10/2015.Tolerating moderately well with hot flashes. She was switched to Anastrozole in 09/2018 when she became postmenopausal.  -She has very  mild axillary and arm swelling from wearing her prosthetic bra and s/p mastectomies. Vibration Machines helps her.  -She has been tolerating anastrozole with manageable joint pian and improved hot flashes. Her weight fluctuates during her intentional weight loss.  -She is clinically doing well. Lab reviewed, her CBC and CMP are within normal limits. Her physical exam was benign. There is no clinical concern for recurrence. -Continue surveillance. No need for mammogram as she is s/p b/l mastectomy.  -Continue Anastrozole.  -F/u in 1 year due to high Cop[place]. She notes her labs and visit here is about $500 out of pocket because her insurance will not cover until she meets a $1500 deductible each year.  -She will f/u with her Gyn and PCP in interim.    3. Hot flush  -Significant and secondary to tamoxifen, butoverall manageable -More tolerable and improved on Anastrozole   4. Bone health  -Her last DEXA was 8 years ago and normal.  -I discussed AI can reduce her bone density.  -She previously postponed DEXA due to high cost. I recommend she try to do one in 2021.    Plan -Lab and f/u in 1 year, OK to do lab with her PCP if costs less  -Continue Anastrozole, refilled today.    No problem-specific Assessment & Plan notes found for this encounter.   No orders of the defined types were placed in this encounter.  All questions were answered. The patient knows to call the clinic with any problems, questions or concerns. No barriers to learning was detected. The total time spent in the appointment was 25 minutes.     Truitt Merle, MD 04/29/2019   I, Joslyn Devon, am acting as scribe for Truitt Merle, MD.   I have reviewed the above documentation for accuracy and completeness, and I agree with the above.

## 2019-04-29 ENCOUNTER — Telehealth: Payer: Self-pay | Admitting: Hematology

## 2019-04-29 ENCOUNTER — Inpatient Hospital Stay: Payer: BC Managed Care – PPO | Attending: Hematology

## 2019-04-29 ENCOUNTER — Inpatient Hospital Stay (HOSPITAL_BASED_OUTPATIENT_CLINIC_OR_DEPARTMENT_OTHER): Payer: BC Managed Care – PPO | Admitting: Hematology

## 2019-04-29 ENCOUNTER — Other Ambulatory Visit: Payer: Self-pay

## 2019-04-29 VITALS — BP 122/74 | HR 79 | Temp 98.5°F | Resp 18 | Ht 63.0 in | Wt 157.1 lb

## 2019-04-29 DIAGNOSIS — R232 Flushing: Secondary | ICD-10-CM | POA: Insufficient documentation

## 2019-04-29 DIAGNOSIS — Z170 Estrogen receptor positive status [ER+]: Secondary | ICD-10-CM | POA: Insufficient documentation

## 2019-04-29 DIAGNOSIS — Z79811 Long term (current) use of aromatase inhibitors: Secondary | ICD-10-CM | POA: Diagnosis not present

## 2019-04-29 DIAGNOSIS — C50512 Malignant neoplasm of lower-outer quadrant of left female breast: Secondary | ICD-10-CM | POA: Diagnosis present

## 2019-04-29 DIAGNOSIS — M7989 Other specified soft tissue disorders: Secondary | ICD-10-CM | POA: Diagnosis not present

## 2019-04-29 DIAGNOSIS — Z9013 Acquired absence of bilateral breasts and nipples: Secondary | ICD-10-CM | POA: Diagnosis not present

## 2019-04-29 DIAGNOSIS — Z79899 Other long term (current) drug therapy: Secondary | ICD-10-CM | POA: Insufficient documentation

## 2019-04-29 LAB — COMPREHENSIVE METABOLIC PANEL
ALT: 31 U/L (ref 0–44)
AST: 23 U/L (ref 15–41)
Albumin: 3.9 g/dL (ref 3.5–5.0)
Alkaline Phosphatase: 116 U/L (ref 38–126)
Anion gap: 9 (ref 5–15)
BUN: 13 mg/dL (ref 6–20)
CO2: 29 mmol/L (ref 22–32)
Calcium: 9.5 mg/dL (ref 8.9–10.3)
Chloride: 102 mmol/L (ref 98–111)
Creatinine, Ser: 0.78 mg/dL (ref 0.44–1.00)
GFR calc Af Amer: >60 mL/min (ref >60)
GFR calc non Af Amer: >60 mL/min (ref >60)
Glucose, Bld: 100 mg/dL — ABNORMAL HIGH (ref 70–99)
Potassium: 4.6 mmol/L (ref 3.5–5.1)
Sodium: 140 mmol/L (ref 135–145)
Total Bilirubin: 0.4 mg/dL (ref 0.3–1.2)
Total Protein: 7.2 g/dL (ref 6.5–8.1)

## 2019-04-29 LAB — CBC WITH DIFFERENTIAL/PLATELET
Abs Immature Granulocytes: 0.02 10*3/uL (ref 0.00–0.07)
Basophils Absolute: 0.1 10*3/uL (ref 0.0–0.1)
Basophils Relative: 1 %
Eosinophils Absolute: 0.2 10*3/uL (ref 0.0–0.5)
Eosinophils Relative: 3 %
HCT: 41.2 % (ref 36.0–46.0)
Hemoglobin: 13.2 g/dL (ref 12.0–15.0)
Immature Granulocytes: 0 %
Lymphocytes Relative: 35 %
Lymphs Abs: 2.1 10*3/uL (ref 0.7–4.0)
MCH: 29.4 pg (ref 26.0–34.0)
MCHC: 32 g/dL (ref 30.0–36.0)
MCV: 91.8 fL (ref 80.0–100.0)
Monocytes Absolute: 0.5 10*3/uL (ref 0.1–1.0)
Monocytes Relative: 9 %
Neutro Abs: 3.1 10*3/uL (ref 1.7–7.7)
Neutrophils Relative %: 52 %
Platelets: 256 10*3/uL (ref 150–400)
RBC: 4.49 MIL/uL (ref 3.87–5.11)
RDW: 12.7 % (ref 11.5–15.5)
WBC: 5.9 10*3/uL (ref 4.0–10.5)
nRBC: 0 % (ref 0.0–0.2)

## 2019-04-29 NOTE — Telephone Encounter (Signed)
Scheduled per los. Patient declined printout  

## 2019-05-03 ENCOUNTER — Encounter: Payer: Self-pay | Admitting: Hematology

## 2019-06-02 ENCOUNTER — Other Ambulatory Visit: Payer: Self-pay

## 2019-06-02 ENCOUNTER — Encounter: Payer: Self-pay | Admitting: Hematology

## 2019-06-02 MED ORDER — ANASTROZOLE 1 MG PO TABS: 1.0000 mg | ORAL_TABLET | Freq: Every day | ORAL | 3 refills | Status: DC

## 2019-06-15 ENCOUNTER — Other Ambulatory Visit: Payer: Self-pay | Admitting: Hematology

## 2019-09-13 ENCOUNTER — Other Ambulatory Visit: Payer: Self-pay | Admitting: Hematology

## 2019-09-14 ENCOUNTER — Other Ambulatory Visit: Payer: Self-pay

## 2019-09-14 NOTE — Telephone Encounter (Signed)
ASA refill as requested

## 2020-03-04 ENCOUNTER — Other Ambulatory Visit: Payer: Self-pay | Admitting: Hematology

## 2020-04-20 ENCOUNTER — Other Ambulatory Visit: Payer: Self-pay

## 2020-04-20 ENCOUNTER — Encounter: Payer: Self-pay | Admitting: Hematology

## 2020-04-20 DIAGNOSIS — C50512 Malignant neoplasm of lower-outer quadrant of left female breast: Secondary | ICD-10-CM

## 2020-04-20 DIAGNOSIS — E7849 Other hyperlipidemia: Secondary | ICD-10-CM

## 2020-04-28 ENCOUNTER — Other Ambulatory Visit: Payer: Self-pay

## 2020-04-28 ENCOUNTER — Inpatient Hospital Stay: Payer: BC Managed Care – PPO

## 2020-04-28 ENCOUNTER — Encounter: Payer: Self-pay | Admitting: Hematology

## 2020-04-28 ENCOUNTER — Inpatient Hospital Stay: Payer: BC Managed Care – PPO | Attending: Hematology | Admitting: Hematology

## 2020-04-28 VITALS — BP 136/75 | HR 84 | Temp 97.2°F | Resp 18 | Ht 63.0 in | Wt 166.0 lb

## 2020-04-28 DIAGNOSIS — Z79811 Long term (current) use of aromatase inhibitors: Secondary | ICD-10-CM | POA: Diagnosis not present

## 2020-04-28 DIAGNOSIS — E785 Hyperlipidemia, unspecified: Secondary | ICD-10-CM | POA: Insufficient documentation

## 2020-04-28 DIAGNOSIS — Z79899 Other long term (current) drug therapy: Secondary | ICD-10-CM | POA: Diagnosis not present

## 2020-04-28 DIAGNOSIS — L659 Nonscarring hair loss, unspecified: Secondary | ICD-10-CM | POA: Diagnosis not present

## 2020-04-28 DIAGNOSIS — Z9013 Acquired absence of bilateral breasts and nipples: Secondary | ICD-10-CM | POA: Insufficient documentation

## 2020-04-28 DIAGNOSIS — N951 Menopausal and female climacteric states: Secondary | ICD-10-CM | POA: Insufficient documentation

## 2020-04-28 DIAGNOSIS — C50512 Malignant neoplasm of lower-outer quadrant of left female breast: Secondary | ICD-10-CM | POA: Diagnosis not present

## 2020-04-28 DIAGNOSIS — Z170 Estrogen receptor positive status [ER+]: Secondary | ICD-10-CM | POA: Diagnosis not present

## 2020-04-28 DIAGNOSIS — R5383 Other fatigue: Secondary | ICD-10-CM | POA: Diagnosis not present

## 2020-04-28 DIAGNOSIS — E7849 Other hyperlipidemia: Secondary | ICD-10-CM

## 2020-04-28 LAB — CMP (CANCER CENTER ONLY)
ALT: 22 U/L (ref 0–44)
AST: 23 U/L (ref 15–41)
Albumin: 4.4 g/dL (ref 3.5–5.0)
Alkaline Phosphatase: 134 U/L — ABNORMAL HIGH (ref 38–126)
Anion gap: 12 (ref 5–15)
BUN: 12 mg/dL (ref 6–20)
CO2: 29 mmol/L (ref 22–32)
Calcium: 10 mg/dL (ref 8.9–10.3)
Chloride: 100 mmol/L (ref 98–111)
Creatinine: 0.85 mg/dL (ref 0.44–1.00)
GFR, Estimated: >60 mL/min (ref >60)
Glucose, Bld: 97 mg/dL (ref 70–99)
Potassium: 4.4 mmol/L (ref 3.5–5.1)
Sodium: 141 mmol/L (ref 135–145)
Total Bilirubin: 0.5 mg/dL (ref 0.3–1.2)
Total Protein: 7.7 g/dL (ref 6.5–8.1)

## 2020-04-28 LAB — CBC WITH DIFFERENTIAL (CANCER CENTER ONLY)
Abs Immature Granulocytes: 0.02 10*3/uL (ref 0.00–0.07)
Basophils Absolute: 0.1 10*3/uL (ref 0.0–0.1)
Basophils Relative: 1 %
Eosinophils Absolute: 0.2 10*3/uL (ref 0.0–0.5)
Eosinophils Relative: 2 %
HCT: 39.9 % (ref 36.0–46.0)
Hemoglobin: 13.2 g/dL (ref 12.0–15.0)
Immature Granulocytes: 0 %
Lymphocytes Relative: 30 %
Lymphs Abs: 2.3 10*3/uL (ref 0.7–4.0)
MCH: 29.6 pg (ref 26.0–34.0)
MCHC: 33.1 g/dL (ref 30.0–36.0)
MCV: 89.5 fL (ref 80.0–100.0)
Monocytes Absolute: 0.6 10*3/uL (ref 0.1–1.0)
Monocytes Relative: 8 %
Neutro Abs: 4.6 10*3/uL (ref 1.7–7.7)
Neutrophils Relative %: 59 %
Platelet Count: 273 10*3/uL (ref 150–400)
RBC: 4.46 MIL/uL (ref 3.87–5.11)
RDW: 12.8 % (ref 11.5–15.5)
WBC Count: 7.7 10*3/uL (ref 4.0–10.5)
nRBC: 0 % (ref 0.0–0.2)

## 2020-04-28 LAB — LIPID PANEL
Cholesterol: 357 mg/dL — ABNORMAL HIGH (ref 0–200)
HDL: 72 mg/dL (ref >40)
LDL Cholesterol: 269 mg/dL — ABNORMAL HIGH (ref 0–99)
Total CHOL/HDL Ratio: 5 RATIO
Triglycerides: 81 mg/dL (ref <150)
VLDL: 16 mg/dL (ref 0–40)

## 2020-04-28 NOTE — Progress Notes (Signed)
[place]   Telephone:(336) [phone] Fax:(336) [phone]   Clinic Follow up Note   Patient Care Team: Rudene Anda, MD as PCP - General (Internal Medicine) Vanessa Kick, MD as Consulting Physician (Obstetrics and Gynecology) Rolm Bookbinder, MD as Consulting Physician (General Surgery)  Date of Service:  04/28/2020  CHIEF COMPLAINT: f/u of left breast cancer  SUMMARY OF ONCOLOGIC HISTORY: Oncology History Overview Note  Breast cancer of lower-outer quadrant of left female breast [facility])   Staging form: Breast, AJCC 7th Edition   - Clinical stage from 07/04/2015: Stage IA (T1b, N0, M0) - Signed by Truitt Merle, MD on 07/19/2015   - Pathologic stage from 08/24/2015: Stage IA (T1c, N0, cM0) - Signed by Truitt Merle, MD on 09/19/2015    Breast cancer of lower-outer quadrant of left female breast ([place])  07/04/2015 Initial Diagnosis   Breast cancer of lower-outer quadrant of left female breast ([place])   07/04/2015 Receptors her2   Your 100% positive, PR 80% positive, HER-2 negative, Ki-67 15%   07/04/2015 Initial Biopsy   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   07/15/2015 Imaging   breast MRI w wo contrast showed biopsy proven malignancy in the lower outer left breast measures 1 x 1 x 0.7 cm. No findings to suggest multifocal or multicentric disease. Right breast was negative.   08/24/2015 Surgery   Bilateral breast mastectomy and sentinel lymph node biopsy   08/24/2015 Pathology Results   Left breast mastectomy showed a 1.3 cm invasive ductal carcinoma, grade 1, low-grade DCIS, LVI (+), surgical margins were negative. 5 left axillary nodes were negative. Right breast mastectomy showed no malignancy, 7 lymph  nodes negative.    08/24/2015 Oncotype testing   Recurrence score 8, which predicts 10 year risk of distant recurrence 6% with tamoxifen   10/02/2015 -  Anti-estrogen oral therapy   Adjuvant tamoxifen 20 mg once daily. Switched to anastrozole on 09/30/18 since postmenopausal.       CURRENT THERAPY:  Tamoxifen 85m daily since 10/2015. Switched to anastrozole on 09/30/18 since postmenopausal.  INTERVAL HISTORY:  Patricia TRUEBAis here for a follow up of breast cancer. She was last seen by me a year ago. She presents to the clinic alone. She reports she quit taking her cholesterol medicine because it caused cramping. She is taking calcium and trying to watch what she eats.  She has gained weight and reports she has more fatigue than she used to. She also reports some hair thinning. She notes she will see her eye doctor on 05/05/20. She notes she has to use her glasses more often. She expressed concern for cataracts, given this is a side effect of anastrozole. She continues follow up with Dr. RHarrington Challenger her GYN, annually every October. She notes she is [facility], 04/29/20.  All other systems were reviewed with the patient and are negative.  MEDICAL HISTORY:  Past Medical History:  Diagnosis Date  . Cancer ([facility] 2017   left breast cancer  . Hyperlipidemia     SURGICAL HISTORY: Past Surgical History:  Procedure Laterality Date  . BREAST BIOPSY  06/28/10   left  . CHOLECYSTECTOMY    . MASTECTOMY  W/ SENTINEL NODE BIOPSY Bilateral 08/24/2015   Procedure: BILATERAL TOTAL MASTECTOMY WITH LEFT SENTINEL LYMPH NODE BIOPSY;  Surgeon: Rolm Bookbinder, MD;  Location: [place];  Service: General;  Laterality: Bilateral;    I have reviewed the social history and family history with the patient and they are unchanged from previous note.  ALLERGIES:  has No Known Allergies.  MEDICATIONS:  Current Outpatient Medications  Medication Sig Dispense  Refill  . anastrozole (ARIMIDEX) 1 MG tablet TAKE 1 TABLET DAILY 90 tablet 3  . ASPIRIN LOW DOSE 81 MG EC tablet TAKE 1 TABLET DAILY 90 tablet 3  . celecoxib (CELEBREX) 200 MG capsule Take 200 mg by mouth as needed.     . Cholecalciferol (VITAMIN D3) 1000 units CAPS Take by mouth.    . DENTA 5000 PLUS 1.1 % CREA dental cream Take 51 g by mouth 2 (two) times daily.    . diphenhydrAMINE (SOMINEX) 25 MG tablet Take by mouth.    . Melatonin 10 MG TABS Take 10 mg by mouth at bedtime.    . Multiple Vitamin (MULTIVITAMIN PO) Take by mouth daily.      . Omega-3 Fatty Acids (FISH OIL) 1200 MG CAPS Take 1 capsule by mouth daily.    . simvastatin (ZOCOR) 20 MG tablet Take 20 mg by mouth daily.     No current facility-administered medications for this visit.    PHYSICAL EXAMINATION: ECOG PERFORMANCE STATUS: 0 - Asymptomatic  Vitals:   04/28/20 1338  BP: 136/75  Pulse: 84  Resp: 18  Temp: (!) 97.2 F (36.2 C)  SpO2: 100%   Filed Weights   04/28/20 1338  Weight: 166 lb (75.3 kg)    GENERAL:alert, no distress and comfortable SKIN: skin color, texture, turgor are normal, no rashes or significant lesions EYES: normal, Conjunctiva are pink and non-injected, sclera clear NECK: supple, thyroid normal size, non-tender, without nodularity LYMPH:  no palpable lymphadenopathy in the cervical, axillary  LUNGS: clear to auscultation and percussion with normal breathing effort HEART: regular rate & rhythm and no murmurs and no lower extremity edema ABDOMEN:abdomen soft, non-tender and normal bowel sounds Musculoskeletal:no cyanosis of digits and no clubbing  NEURO: alert & oriented x 3 with fluent speech, no focal motor/sensory deficits  LABORATORY DATA:  I have reviewed the data as listed CBC Latest Ref Rng & Units 04/28/2020 04/29/2019 09/17/2018  WBC 4.0 - 10.5 K/uL 7.7 5.9 6.7  Hemoglobin 12.0 - 15.0 g/dL 13.2 13.2 13.1  Hematocrit 36.0 - 46.0 % 39.9 41.2 40.2  Platelets 150 - 400 K/uL 273 256  251     CMP Latest Ref Rng & Units 04/28/2020 04/29/2019 09/17/2018  Glucose 70 - 99 mg/dL 97 100(H) 97  BUN 6 - 20 mg/dL '12 13 11  ' Creatinine 0.44 - 1.00 mg/dL 0.85 0.78 0.78  Sodium 135 - 145 mmol/L 141 140 141  Potassium 3.5 - 5.1 mmol/L 4.4 4.6 4.1  Chloride 98 - 111 mmol/L 100 102 102  CO2 22 - 32 mmol/L '29 29 31  ' Calcium 8.9 - 10.3 mg/dL 10.0 9.5 9.3  Total Protein 6.5 - 8.1 g/dL 7.7 7.2 7.2  Total Bilirubin 0.3 - 1.2 mg/dL 0.5 0.4 0.3  Alkaline Phos 38 - 126 U/L 134(H) 116 95  AST 15 - 41 U/L '23 23 21  ' ALT 0 - 44 U/L '22 31 20      ' RADIOGRAPHIC STUDIES: I have personally reviewed the radiological images as listed and agreed with the findings in  the report. No results found.   ASSESSMENT & PLAN:  LATEESHA BEZOLD is a 55 y.o. female with   1. 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 She was treated with bilateral mastectomy and currently on Tamoxifen since 10/2015.Tolerating moderately well with hot flashes. She was switched to Anastrozole in 09/2018 when she became postmenopausal.  -She has very mild axillary and arm swelling from wearing her prosthetic bra and s/p mastectomies. Vibration Machines helps her.  -She has been tolerating anastrozole with manageable joint pain and improved hot flashes. She does have some weight gain and some hair thinning, as well as fatigue. I recommended a low-carb diet and continued exercise. -Continue surveillance. No need for mammogram as she is s/p b/l mastectomy.  -I recommended she continue anastrozole until 2025, but if she has problems, she can stop early because she took tamoxifen prior. -She is clinically doing well. Lab reviewed, her CBC and CMP are within normal limits. Her physical exam was benign. There is no clinical concern for recurrence.  3. Hot flashes  -Significant and  secondary to tamoxifen, butoverall manageable -More tolerable and improved on Anastrozole   4. Bone health  -Her last DEXA was 8 years ago and normal.  -IdiscussedAI can reduce her bone density.  -She previously postponed DEXA due to high cost.   Plan -Continue Anastrozole, refilled today.  -Labs and f/u in 1 year    No problem-specific Assessment & Plan notes found for this encounter.   No orders of the defined types were placed in this encounter.  All questions were answered. The patient knows to call the clinic with any problems, questions or concerns. No barriers to learning was detected. The total time spent in the appointment was 25 minutes.     Truitt Merle, MD 04/28/2020   I, Wilburn Mylar, am acting as scribe for Truitt Merle, MD.   I have reviewed the above documentation for accuracy and completeness, and I agree with the above.

## 2020-05-02 ENCOUNTER — Encounter: Payer: Self-pay | Admitting: Hematology

## 2020-05-02 ENCOUNTER — Other Ambulatory Visit: Payer: Self-pay | Admitting: Nurse Practitioner

## 2020-05-02 ENCOUNTER — Telehealth: Payer: Self-pay

## 2020-05-02 MED ORDER — ASPIRIN 81 MG PO TBEC: 81.0000 mg | DELAYED_RELEASE_TABLET | Freq: Every day | ORAL | 3 refills | Status: AC

## 2020-05-02 MED ORDER — ANASTROZOLE 1 MG PO TABS: 1.0000 mg | ORAL_TABLET | Freq: Every day | ORAL | 3 refills | Status: DC

## 2020-05-02 NOTE — Telephone Encounter (Signed)
-----   Message from Truitt Merle, MD sent at 04/30/2020 11:10 PM EDT ----- Please let pt know her lipid panel result, and fax over to her PCP. She needs to f/u with PCP for high LDL. Thanks   Truitt Merle  04/30/2020

## 2020-05-02 NOTE — Telephone Encounter (Signed)
Called and spoke with pt made her aware of lipid results dn recommendations to f/u with PCP pt understands and states she will call to make an appointment

## 2021-01-16 ENCOUNTER — Other Ambulatory Visit: Payer: Self-pay | Admitting: Nurse Practitioner

## 2021-01-16 ENCOUNTER — Telehealth: Payer: Self-pay

## 2021-01-16 NOTE — Telephone Encounter (Signed)
Pt requested refill of Anastrozole medication. This LPN asked pt if she was still actively taking it and pt confirms she is.

## 2021-04-25 ENCOUNTER — Encounter: Payer: Self-pay | Admitting: Hematology

## 2021-04-26 ENCOUNTER — Other Ambulatory Visit: Payer: Self-pay

## 2021-04-26 ENCOUNTER — Other Ambulatory Visit: Payer: Self-pay | Admitting: Nurse Practitioner

## 2021-04-26 DIAGNOSIS — C50512 Malignant neoplasm of lower-outer quadrant of left female breast: Secondary | ICD-10-CM

## 2021-04-26 DIAGNOSIS — E7849 Other hyperlipidemia: Secondary | ICD-10-CM

## 2021-04-27 ENCOUNTER — Inpatient Hospital Stay: Payer: BC Managed Care – PPO | Attending: Hematology

## 2021-04-27 ENCOUNTER — Other Ambulatory Visit: Payer: Self-pay

## 2021-04-27 ENCOUNTER — Encounter: Payer: Self-pay | Admitting: Hematology

## 2021-04-27 ENCOUNTER — Inpatient Hospital Stay (HOSPITAL_BASED_OUTPATIENT_CLINIC_OR_DEPARTMENT_OTHER): Payer: BC Managed Care – PPO | Admitting: Hematology

## 2021-04-27 VITALS — BP 138/76 | HR 91 | Temp 97.6°F | Resp 19 | Wt 176.2 lb

## 2021-04-27 DIAGNOSIS — Z79811 Long term (current) use of aromatase inhibitors: Secondary | ICD-10-CM | POA: Insufficient documentation

## 2021-04-27 DIAGNOSIS — Z9013 Acquired absence of bilateral breasts and nipples: Secondary | ICD-10-CM | POA: Insufficient documentation

## 2021-04-27 DIAGNOSIS — R0781 Pleurodynia: Secondary | ICD-10-CM | POA: Insufficient documentation

## 2021-04-27 DIAGNOSIS — E7849 Other hyperlipidemia: Secondary | ICD-10-CM

## 2021-04-27 DIAGNOSIS — C50512 Malignant neoplasm of lower-outer quadrant of left female breast: Secondary | ICD-10-CM

## 2021-04-27 DIAGNOSIS — Z170 Estrogen receptor positive status [ER+]: Secondary | ICD-10-CM | POA: Diagnosis not present

## 2021-04-27 DIAGNOSIS — Z79899 Other long term (current) drug therapy: Secondary | ICD-10-CM | POA: Diagnosis not present

## 2021-04-27 LAB — CBC WITH DIFFERENTIAL (CANCER CENTER ONLY)
Abs Immature Granulocytes: 0.02 10*3/uL (ref 0.00–0.07)
Basophils Absolute: 0.1 10*3/uL (ref 0.0–0.1)
Basophils Relative: 1 %
Eosinophils Absolute: 0.1 10*3/uL (ref 0.0–0.5)
Eosinophils Relative: 2 %
HCT: 39.5 % (ref 36.0–46.0)
Hemoglobin: 13.4 g/dL (ref 12.0–15.0)
Immature Granulocytes: 0 %
Lymphocytes Relative: 29 %
Lymphs Abs: 2.1 10*3/uL (ref 0.7–4.0)
MCH: 30 pg (ref 26.0–34.0)
MCHC: 33.9 g/dL (ref 30.0–36.0)
MCV: 88.4 fL (ref 80.0–100.0)
Monocytes Absolute: 0.5 10*3/uL (ref 0.1–1.0)
Monocytes Relative: 7 %
Neutro Abs: 4.4 10*3/uL (ref 1.7–7.7)
Neutrophils Relative %: 61 %
Platelet Count: 265 10*3/uL (ref 150–400)
RBC: 4.47 MIL/uL (ref 3.87–5.11)
RDW: 12.7 % (ref 11.5–15.5)
WBC Count: 7.2 10*3/uL (ref 4.0–10.5)
nRBC: 0 % (ref 0.0–0.2)

## 2021-04-27 LAB — CMP (CANCER CENTER ONLY)
ALT: 27 U/L (ref 0–44)
AST: 23 U/L (ref 15–41)
Albumin: 4.5 g/dL (ref 3.5–5.0)
Alkaline Phosphatase: 134 U/L — ABNORMAL HIGH (ref 38–126)
Anion gap: 6 (ref 5–15)
BUN: 14 mg/dL (ref 6–20)
CO2: 30 mmol/L (ref 22–32)
Calcium: 9.8 mg/dL (ref 8.9–10.3)
Chloride: 102 mmol/L (ref 98–111)
Creatinine: 0.76 mg/dL (ref 0.44–1.00)
GFR, Estimated: >60 mL/min (ref >60)
Glucose, Bld: 100 mg/dL — ABNORMAL HIGH (ref 70–99)
Potassium: 4.1 mmol/L (ref 3.5–5.1)
Sodium: 138 mmol/L (ref 135–145)
Total Bilirubin: 0.5 mg/dL (ref 0.3–1.2)
Total Protein: 7.5 g/dL (ref 6.5–8.1)

## 2021-04-27 LAB — LIPID PANEL
Cholesterol: 210 mg/dL — ABNORMAL HIGH (ref 0–200)
HDL: 77 mg/dL (ref >40)
LDL Cholesterol: 119 mg/dL — ABNORMAL HIGH (ref 0–99)
Total CHOL/HDL Ratio: 2.7 RATIO
Triglycerides: 68 mg/dL (ref <150)
VLDL: 14 mg/dL (ref 0–40)

## 2021-04-27 MED ORDER — CELECOXIB 200 MG PO CAPS
200.0000 mg | ORAL_CAPSULE | ORAL | 1 refills | Status: DC | PRN
Start: 2021-04-27 — End: 2021-07-24

## 2021-04-27 NOTE — Progress Notes (Signed)
?[place]   ?[phone] [phone]   ?Clinic Follow up Note  ? ?Patient Care Team: ?Rudene Anda, MD as PCP - General (Internal Medicine) ?Vanessa Kick, MD as Consulting Physician (Obstetrics and Gynecology) ?Rolm Bookbinder, MD as Consulting Physician (General Surgery) ? ?Date of Service:  04/27/2021 ? ?CHIEF COMPLAINT: f/u of left breast cancer ? ?CURRENT THERAPY:  ?Antiestrogen therapy, started 10/2015 with tamoxifen, currently on anastrozole since 09/30/18 ? ?ASSESSMENT & PLAN:  ?Patricia Larson is a 56 y.o. post-menopausal female with  ? ?1. 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 S/p bilateral mastectomy.  ?-she started Tamoxifen in 10/2015. She was switched to Anastrozole in 09/2018 when she became postmenopausal. I recommended she continue anastrozole until 2025, but if she has problems, she can stop early because she took tamoxifen prior. ?-she is having new pains (see #2).  ?-She is otherwise clinically doing well. Lab reviewed, her CBC and CMP are within normal limits. Her physical exam was benign.  I do not have high suspicion for cancer recurrence, but due to her atypical chest pain, new onset rib pain, I will obtain a PET scan to rule out recurrent metastatic cancer. ?-Continue surveillance. No need for mammogram as she is s/p b/l mastectomy.  ?-f/u in 6 months  ?  ?2. Back and Chest pain ?-she reports intermittent back and chest pain, worsened with twisting movements. She endorses taking celebrex as needed for pain which helps.  She also noticed recently new onset bilateral rib pain, she denies cough or shortness of breath. ?-I recommend she try OTC reflux medicine or Tylenol for her pain as well. ?-to reassure her, I will order scan to rule out metastatic disease. Hopefully her insurance will approve PET scan. ?  ?3.  Bone health  ?-Her last DEXA was 8 years ago and normal.  ?-I previously discussed AI can reduce her bone density.  ?-She previously postponed DEXA due to high cost. ?  ?  ?Plan ?-Continue Anastrozole ?-I refilled celebrex for her  ?-PET scan to be done soon due to her chest pain, will call her with result  ?-Labs and f/u in 6 months, per pt request ? ? ?No problem-specific Assessment & Plan notes found for this encounter. ? ? ?SUMMARY OF ONCOLOGIC HISTORY: ?Oncology History Overview Note  ?Breast cancer of lower-outer quadrant of left female breast ([place]) ?  Staging form: Breast, AJCC 7th Edition ?  - Clinical stage from 07/04/2015: Stage IA (T1b, N0, M0) - Signed by Truitt Merle, MD on 07/19/2015 ?  - Pathologic stage from 08/24/2015: Stage IA (T1c, N0, cM0) - Signed by Truitt Merle, MD on 09/19/2015 ? ? ?  ?Breast cancer of lower-outer quadrant of left female breast ([place])  ?07/04/2015 Initial Diagnosis  ? Breast cancer of lower-outer quadrant of left female breast ([place]) ? ?  ?07/04/2015 Receptors her2  ? Your 100% positive, PR 80% positive, HER-2 negative, Ki-67 15% ? ?  ?07/04/2015 Initial Biopsy  ? Left breast mass biopsy showed invasive ductal carcinoma, DCIS, grade 1 ? ?  ?07/04/2015 Mammogram  ? diagnostic mammogram and US showed a asymmetry architecture distortion in the lower outer quadrant of left breast, measuring 0.8 x 0.6 x 0.6 cm, 3 cm from the nipple, ultrasound of the left axilla was negative for lymphadenopathy. ? ?  ?07/15/2015 Imaging  ? breast MRI w wo contrast  showed biopsy proven malignancy in the lower outer left breast measures 1 x 1 x 0.7 cm. No findings to suggest multifocal or multicentric disease. Right breast was negative. ? ?  ?08/24/2015 Surgery  ? Bilateral breast mastectomy and sentinel lymph node biopsy ? ?  ?08/24/2015 Pathology Results  ? Left breast mastectomy showed a 1.3 cm invasive ductal carcinoma, grade 1, low-grade DCIS, LVI (+), surgical margins were negative. 5 left axillary nodes were negative.  Right breast mastectomy showed no malignancy, 7 lymph nodes negative. ? ? ?  ?08/24/2015 Oncotype testing  ? Recurrence score 8, which predicts 10 year risk of distant recurrence 6% with tamoxifen ? ?  ?10/02/2015 -  Anti-estrogen oral therapy  ? Adjuvant tamoxifen 20 mg once daily. Switched to anastrozole on 09/30/18 since postmenopausal.  ?  ? ? ? ?INTERVAL HISTORY:  ?Patricia Larson is here for a follow up of breast cancer. She was last seen by me a year ago. She presents to the clinic alone. ?She reports some pain in her chest and back. She endorses using celebrex if the pain gets bad. She explains that some days the pain is worse than others. She denies any shortness of breath but notes occasionally her heart rate will go up. ?  ?All other systems were reviewed with the patient and are negative. ? ?MEDICAL HISTORY:  ?Past Medical History:  ?Diagnosis Date  ? Cancer [facility]) 2017  ? left breast cancer  ? Hyperlipidemia   ? ? ?SURGICAL HISTORY: ?Past Surgical History:  ?Procedure Laterality Date  ? BREAST BIOPSY  06/28/10  ? left  ? CHOLECYSTECTOMY    ? MASTECTOMY W/ SENTINEL NODE BIOPSY Bilateral 08/24/2015  ? Procedure: BILATERAL TOTAL MASTECTOMY WITH LEFT SENTINEL LYMPH NODE BIOPSY;  Surgeon: Rolm Bookbinder, MD;  Location: [place];  Service: General;  Laterality: Bilateral;  ? ? ?I have reviewed the social history and family history with the patient and they are unchanged from previous note. ? ?ALLERGIES:  has No Known Allergies. ? ?MEDICATIONS:  ?Current Outpatient Medications  ?Medication Sig Dispense Refill  ? anastrozole (ARIMIDEX) 1 MG tablet TAKE 1 TABLET DAILY 90 tablet 3  ? aspirin (ASPIRIN LOW DOSE) 81 MG EC tablet Take 1 tablet (81 mg total) by mouth daily. Swallow whole. 90 tablet 3  ? celecoxib (CELEBREX) 200 MG capsule Take 1 capsule (200 mg total) by mouth as needed. 30 capsule 1  ? Cholecalciferol (VITAMIN D3) 1000 units CAPS Take by mouth.    ? DENTA 5000 PLUS 1.1 % CREA dental cream  Take 51 g by mouth 2 (two) times daily.    ? diphenhydrAMINE (SOMINEX) 25 MG tablet Take by mouth.    ? Melatonin 10 MG TABS Take 10 mg by mouth at bedtime.    ? Multiple Vitamin (MULTIVITAMIN PO) Take by mouth daily.      ? Omega-3 Fatty Acids (FISH OIL) 1200 MG CAPS Take 1 capsule by mouth daily.    ? simvastatin (ZOCOR) 20 MG tablet Take 20 mg by mouth daily.    ? ?No current facility-administered medications for this visit.  ? ? ?PHYSICAL EXAMINATION: ?ECOG PERFORMANCE STATUS: 1 - Symptomatic but completely ambulatory ? ?Vitals:  ? 04/27/21 1452  ?BP: 138/76  ?Pulse: 91  ?Resp: 19  ?Temp: 97.6 ?F (36.4 ?C)  ?SpO2: 99%  ? ?Wt Readings from Last 3 Encounters:  ?04/27/21 176 lb 3 oz (79.9 kg)  ?04/28/20 166 lb (75.3 kg)  ?04/29/19 157 lb 1.6 oz (71.3  kg)  ?  ? ?GENERAL:alert, no distress and comfortable ?SKIN: skin color, texture, turgor are normal, no rashes or significant lesions ?EYES: normal, Conjunctiva are pink and non-injected, sclera clear  ?NECK: supple, thyroid normal size, non-tender, without nodularity ?LYMPH:  no palpable lymphadenopathy in the cervical, axillary ?LUNGS: clear to auscultation and percussion with normal breathing effort ?HEART: regular rate & rhythm and no murmurs and no lower extremity edema ?ABDOMEN:abdomen soft, non-tender and normal bowel sounds ?Musculoskeletal:no cyanosis of digits and no clubbing  ?NEURO: alert & oriented x 3 with fluent speech, no focal motor/sensory deficits ?BREAST: No palpable mass, nodules or adenopathy bilaterally. Breast exam benign.  ? ?LABORATORY DATA:  ?I have reviewed the data as listed ? ?  Latest Ref Rng & Units 04/27/2021  ?  2:13 PM 04/28/2020  ?  1:22 PM 04/29/2019  ? 11:07 AM  ?CBC  ?WBC 4.0 - 10.5 K/uL 7.2   7.7   5.9    ?Hemoglobin 12.0 - 15.0 g/dL 13.4   13.2   13.2    ?Hematocrit 36.0 - 46.0 % 39.5   39.9   41.2    ?Platelets 150 - 400 K/uL 265   273   256    ? ? ? ? ?  Latest Ref Rng & Units 04/27/2021  ?  2:13 PM 04/28/2020  ?  1:22 PM 04/29/2019   ? 11:07 AM  ?CMP  ?Glucose 70 - 99 mg/dL 100   97   100    ?BUN 6 - 20 mg/dL _0 ?Creatinine 0.44 - 1.00 mg/dL 0.76   0.85   0.78    ?Sodium 135 - 145 mmol/L 138   141   140    ?Potassium

## 2021-04-28 ENCOUNTER — Other Ambulatory Visit: Payer: Self-pay

## 2021-04-28 DIAGNOSIS — C50512 Malignant neoplasm of lower-outer quadrant of left female breast: Secondary | ICD-10-CM

## 2021-05-02 NOTE — Progress Notes (Signed)
Lipid results faxed to Dr. Mina Marble per providers request.  Fax number is [phone].  No further concerns at this time.   ?

## 2021-05-03 ENCOUNTER — Telehealth: Payer: Self-pay | Admitting: Hematology

## 2021-05-03 NOTE — Telephone Encounter (Signed)
Scheduled follow-up appointment per 4/27 los. Patient is aware. 

## 2021-05-12 ENCOUNTER — Ambulatory Visit (HOSPITAL_COMMUNITY)
Admission: RE | Admit: 2021-05-12 | Discharge: 2021-05-12 | Disposition: A | Discharge status: Home / Self Care | Payer: BC Managed Care – PPO | Source: Ambulatory Visit | Attending: Hematology | Admitting: Hematology

## 2021-05-12 ENCOUNTER — Encounter (HOSPITAL_COMMUNITY)
Admission: RE | Admit: 2021-05-12 | Discharge: 2021-05-12 | Disposition: A | Discharge status: Home / Self Care | Payer: BC Managed Care – PPO | Source: Ambulatory Visit | Attending: Hematology | Admitting: Hematology

## 2021-05-12 DIAGNOSIS — Z170 Estrogen receptor positive status [ER+]: Secondary | ICD-10-CM

## 2021-05-12 DIAGNOSIS — C50512 Malignant neoplasm of lower-outer quadrant of left female breast: Secondary | ICD-10-CM | POA: Diagnosis present

## 2021-05-12 MED ORDER — TECHNETIUM TC 99M MEDRONATE IV KIT
20.0000 | PACK | Freq: Once | INTRAVENOUS | Status: AC | PRN
  Administered 2021-05-12: 20 via INTRAVENOUS

## 2021-05-12 MED ORDER — SODIUM CHLORIDE (PF) 0.9 % IJ SOLN
INTRAMUSCULAR | Status: AC
  Filled 2021-05-12: qty 50

## 2021-05-12 MED ORDER — IOHEXOL 300 MG/ML  SOLN
100.0000 mL | Freq: Once | INTRAMUSCULAR | Status: AC | PRN
  Administered 2021-05-12: 100 mL via INTRAVENOUS

## 2021-05-17 ENCOUNTER — Encounter: Payer: Self-pay | Admitting: Hematology

## 2021-05-19 ENCOUNTER — Encounter: Payer: Self-pay | Admitting: Hematology

## 2021-05-22 ENCOUNTER — Other Ambulatory Visit: Payer: Self-pay

## 2021-05-23 ENCOUNTER — Other Ambulatory Visit: Payer: Self-pay

## 2021-07-24 ENCOUNTER — Other Ambulatory Visit: Payer: Self-pay | Admitting: Hematology

## 2021-10-11 ENCOUNTER — Other Ambulatory Visit: Payer: Self-pay | Admitting: Hematology

## 2021-10-26 ENCOUNTER — Encounter: Payer: Self-pay | Admitting: Hematology

## 2021-10-26 ENCOUNTER — Inpatient Hospital Stay: Payer: BC Managed Care – PPO | Attending: Hematology | Admitting: Hematology

## 2021-10-26 ENCOUNTER — Inpatient Hospital Stay: Payer: BC Managed Care – PPO

## 2021-10-26 ENCOUNTER — Other Ambulatory Visit: Payer: Self-pay

## 2021-10-26 VITALS — BP 116/54 | HR 77 | Temp 98.1°F | Ht 63.0 in | Wt 179.1 lb

## 2021-10-26 DIAGNOSIS — Z170 Estrogen receptor positive status [ER+]: Secondary | ICD-10-CM | POA: Insufficient documentation

## 2021-10-26 DIAGNOSIS — Z79811 Long term (current) use of aromatase inhibitors: Secondary | ICD-10-CM | POA: Insufficient documentation

## 2021-10-26 DIAGNOSIS — C50512 Malignant neoplasm of lower-outer quadrant of left female breast: Secondary | ICD-10-CM | POA: Diagnosis present

## 2021-10-26 LAB — CMP (CANCER CENTER ONLY)
ALT: 23 U/L (ref 0–44)
AST: 19 U/L (ref 15–41)
Albumin: 4.4 g/dL (ref 3.5–5.0)
Alkaline Phosphatase: 136 U/L — ABNORMAL HIGH (ref 38–126)
Anion gap: 5 (ref 5–15)
BUN: 15 mg/dL (ref 6–20)
CO2: 31 mmol/L (ref 22–32)
Calcium: 9.6 mg/dL (ref 8.9–10.3)
Chloride: 103 mmol/L (ref 98–111)
Creatinine: 0.82 mg/dL (ref 0.44–1.00)
GFR, Estimated: >60 mL/min (ref >60)
Glucose, Bld: 90 mg/dL (ref 70–99)
Potassium: 4.4 mmol/L (ref 3.5–5.1)
Sodium: 139 mmol/L (ref 135–145)
Total Bilirubin: 0.3 mg/dL (ref 0.3–1.2)
Total Protein: 7.3 g/dL (ref 6.5–8.1)

## 2021-10-26 LAB — CBC WITH DIFFERENTIAL (CANCER CENTER ONLY)
Abs Immature Granulocytes: 0.01 10*3/uL (ref 0.00–0.07)
Basophils Absolute: 0 10*3/uL (ref 0.0–0.1)
Basophils Relative: 1 %
Eosinophils Absolute: 0.2 10*3/uL (ref 0.0–0.5)
Eosinophils Relative: 3 %
HCT: 40.6 % (ref 36.0–46.0)
Hemoglobin: 13.5 g/dL (ref 12.0–15.0)
Immature Granulocytes: 0 %
Lymphocytes Relative: 31 %
Lymphs Abs: 2.1 10*3/uL (ref 0.7–4.0)
MCH: 29.5 pg (ref 26.0–34.0)
MCHC: 33.3 g/dL (ref 30.0–36.0)
MCV: 88.8 fL (ref 80.0–100.0)
Monocytes Absolute: 0.5 10*3/uL (ref 0.1–1.0)
Monocytes Relative: 8 %
Neutro Abs: 3.9 10*3/uL (ref 1.7–7.7)
Neutrophils Relative %: 57 %
Platelet Count: 225 10*3/uL (ref 150–400)
RBC: 4.57 MIL/uL (ref 3.87–5.11)
RDW: 12.5 % (ref 11.5–15.5)
WBC Count: 6.8 10*3/uL (ref 4.0–10.5)
nRBC: 0 % (ref 0.0–0.2)

## 2021-10-26 MED ORDER — ANASTROZOLE 1 MG PO TABS: 1.0000 mg | ORAL_TABLET | Freq: Every day | ORAL | 3 refills | Status: DC

## 2021-10-26 MED ORDER — CELECOXIB 200 MG PO CAPS: ORAL_CAPSULE | ORAL | 2 refills | Status: DC

## 2021-10-26 NOTE — Progress Notes (Addendum)
[place]   [phone] [phone]   Clinic Follow up Note   Patient Care Team: Rudene Anda, MD as PCP - General (Internal Medicine) Vanessa Kick, MD as Consulting Physician (Obstetrics and Gynecology) Rolm Bookbinder, MD as Consulting Physician (General Surgery)  Date of Service:  10/26/2021  CHIEF COMPLAINT: f/u of left breast cancer  CURRENT THERAPY:  Antiestrogen therapy, started 10/2015, currently on anastrozole since 09/30/18  ASSESSMENT & PLAN:  Patricia Larson is a 56 y.o. post-menopausal female with   1. 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 S/p bilateral mastectomy.  -she started Tamoxifen in 10/2015. She was switched to Anastrozole in 09/2018 when she became postmenopausal. I recommended she continue anastrozole until 2025, but if she has problems, she can stop early because she took tamoxifen prior. -She developed some chest pain and back pain in early 2023, CT and bone scan was negative for cancer recurrence. -She is clinically doing well, still has some chest pain intermittent which is managed by Celebrex.  Exam was unremarkable today, lab reviewed.  No clinical concern for recurrence. -Continue surveillance. No need for mammogram as she is s/p b/l mastectomy.  -f/u in 6 months    2. Back and Chest pain -at 04/27/21 visit, she reported new, intermittent back and chest/ribcage pain. -CT CAP and bone scan on 05/12/21 were both negative. -Stable, she has been using Celebrex as needed  3. Bone health  -Her last DEXA was 8 years ago and normal.  -I previously discussed AI can reduce her bone density.  -She previously postponed DEXA due to high cost.     Plan -Continue Anastrozole -I refilled anastrozole and Celebrex -Labs and f/u in 12 months   No problem-specific Assessment & Plan notes found for this encounter.   SUMMARY OF  ONCOLOGIC HISTORY: Oncology History Overview Note  Breast cancer of lower-outer quadrant of left female breast [facility])   Staging form: Breast, AJCC 7th Edition   - Clinical stage from 07/04/2015: Stage IA (T1b, N0, M0) - Signed by Truitt Merle, MD on 07/19/2015   - Pathologic stage from 08/24/2015: Stage IA (T1c, N0, cM0) - Signed by Truitt Merle, MD on 09/19/2015    Breast cancer of lower-outer quadrant of left female breast ([place])  07/04/2015 Initial Diagnosis   Breast cancer of lower-outer quadrant of left female breast ([place])   07/04/2015 Receptors her2   Your 100% positive, PR 80% positive, HER-2 negative, Ki-67 15%   07/04/2015 Initial Biopsy   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   07/15/2015 Imaging   breast MRI w wo contrast showed biopsy proven malignancy in the lower outer left breast measures 1 x 1 x 0.7 cm. No findings to suggest multifocal or multicentric disease. Right breast was negative.   08/24/2015 Surgery   Bilateral breast mastectomy and sentinel lymph node biopsy   08/24/2015 Pathology Results   Left breast mastectomy showed a 1.3 cm invasive ductal carcinoma, grade 1, low-grade DCIS, LVI (+), surgical margins were negative. 5 left axillary nodes were negative. Right breast mastectomy showed no malignancy, 7 lymph nodes negative.  08/24/2015 Oncotype testing   Recurrence score 8, which predicts 10 year risk of distant recurrence 6% with tamoxifen   10/02/2015 -  Anti-estrogen oral therapy   Adjuvant tamoxifen 20 mg once daily. Switched to anastrozole on 09/30/18 since postmenopausal.       INTERVAL HISTORY:  Patricia Larson is here for a follow up of breast cancer. She was last seen by me on  04/27/21. She presents to the clinic alone.  She is currently doing well, denies any new concerns since last visit 6 months ago.  She still has intermittent chest and upper back pain, for which she takes Celebrex for 5 times a week.  No other new pain, appetite and energy level are normal.  She is tolerating anastrozole well, with mild hot flashes and fatigue.  No other new complaints.   All other systems were reviewed with the patient and are negative.  MEDICAL HISTORY:  Past Medical History:  Diagnosis Date   Cancer ([place]) 2017   left breast cancer   Hyperlipidemia     SURGICAL HISTORY: Past Surgical History:  Procedure Laterality Date   BREAST BIOPSY  06/28/10   left   CHOLECYSTECTOMY     MASTECTOMY W/ SENTINEL NODE BIOPSY Bilateral 08/24/2015   Procedure: BILATERAL TOTAL MASTECTOMY WITH LEFT SENTINEL LYMPH NODE BIOPSY;  Surgeon: Rolm Bookbinder, MD;  Location: [place];  Service: General;  Laterality: Bilateral;    I have reviewed the social history and family history with the patient and they are unchanged from previous note.  ALLERGIES:  has No Known Allergies.  MEDICATIONS:  Current Outpatient Medications  Medication Sig Dispense Refill   anastrozole (ARIMIDEX) 1 MG tablet Take 1 tablet (1 mg total) by mouth daily. 90 tablet 3   aspirin (ASPIRIN LOW DOSE) 81 MG EC tablet Take 1 tablet (81 mg total) by mouth daily. Swallow whole. 90 tablet 3   celecoxib (CELEBREX) 200 MG capsule TAKE 1 CAPSULE AS NEEDED 90 capsule 2   Cholecalciferol (VITAMIN D3) 1000 units CAPS Take by mouth.     DENTA 5000 PLUS 1.1 % CREA dental cream Take 51 g by mouth 2 (two) times daily.     diphenhydrAMINE (SOMINEX) 25 MG tablet Take by mouth.     Melatonin 10 MG TABS Take 10 mg by mouth at bedtime.     Multiple Vitamin (MULTIVITAMIN PO) Take by mouth daily.       Omega-3 Fatty Acids (FISH OIL) 1200 MG CAPS Take 1 capsule by mouth daily.     simvastatin (ZOCOR) 20 MG tablet Take 20 mg  by mouth daily.     No current facility-administered medications for this visit.    PHYSICAL EXAMINATION: ECOG PERFORMANCE STATUS: 0 - Asymptomatic  Vitals:   10/26/21 1429  BP: (!) 116/54  Pulse: 77  Temp: 98.1 F (36.7 C)  SpO2: 98%   Wt Readings from Last 3 Encounters:  10/26/21 179 lb 1.6 oz (81.2 kg)  04/27/21 176 lb 3 oz (79.9 kg)  04/28/20 166 lb (75.3 kg)     GENERAL:alert, no distress and comfortable SKIN: skin color, texture, turgor are normal, no rashes or significant lesions EYES: normal, Conjunctiva are pink and non-injected, sclera clear NECK: supple, thyroid normal size, non-tender, without nodularity LYMPH:  no palpable lymphadenopathy in the cervical, axillary LUNGS: clear to auscultation and percussion with normal breathing effort HEART: regular rate & rhythm and no murmurs and no lower extremity edema ABDOMEN:abdomen soft, non-tender and normal bowel sounds Musculoskeletal:no  cyanosis of digits and no clubbing  NEURO: alert & oriented x 3 with fluent speech, no focal motor/sensory deficits BREAST: Status post bilateral mastectomy, no palpable mass or nodule on chest walls, no adenopathy bilaterally.   LABORATORY DATA:  I have reviewed the data as listed    Latest Ref Rng & Units 10/26/2021    1:48 PM 04/27/2021    2:13 PM 04/28/2020    1:22 PM  CBC  WBC 4.0 - 10.5 K/uL 6.8  7.2  7.7   Hemoglobin 12.0 - 15.0 g/dL 13.5  13.4  13.2   Hematocrit 36.0 - 46.0 % 40.6  39.5  39.9   Platelets 150 - 400 K/uL 225  265  273         Latest Ref Rng & Units 10/26/2021    1:48 PM 04/27/2021    2:13 PM 04/28/2020    1:22 PM  CMP  Glucose 70 - 99 mg/dL 90  100  97   BUN 6 - 20 mg/dL _0 Creatinine 0.44 - 1.00 mg/dL 0.82  0.76  0.85   Sodium 135 - 145 mmol/L 139  138  141   Potassium 3.5 - 5.1 mmol/L 4.4  4.1  4.4   Chloride 98 - 111 mmol/L 103  102  100   CO2 22 - 32 mmol/L _1 Calcium 8.9 - 10.3 mg/dL 9.6  9.8  10.0   Total Protein 6.5 -  8.1 g/dL 7.3  7.5  7.7   Total Bilirubin 0.3 - 1.2 mg/dL 0.3  0.5  0.5   Alkaline Phos 38 - 126 U/L 136  134  134   AST 15 - 41 U/L _2 ALT 0 - 44 U/L _3 RADIOGRAPHIC STUDIES: I have personally reviewed the radiological images as listed and agreed with the findings in the report. No results found.    No orders of the defined types were placed in this encounter.  All questions were answered. The patient knows to call the clinic with any problems, questions or concerns. No barriers to learning was detected. The total time spent in the appointment was 25 minutes.     Truitt Merle, MD 10/26/2021   I, Wilburn Mylar, am acting as scribe for Truitt Merle, MD.   I have reviewed the above documentation for accuracy and completeness, and I agree with the above.

## 2022-06-14 ENCOUNTER — Encounter: Payer: Self-pay | Admitting: Hematology

## 2022-10-17 ENCOUNTER — Encounter: Payer: Self-pay | Admitting: Hematology

## 2022-10-19 ENCOUNTER — Other Ambulatory Visit: Payer: Self-pay | Admitting: *Deleted

## 2022-10-19 DIAGNOSIS — Z170 Estrogen receptor positive status [ER+]: Secondary | ICD-10-CM

## 2022-10-29 ENCOUNTER — Inpatient Hospital Stay (HOSPITAL_BASED_OUTPATIENT_CLINIC_OR_DEPARTMENT_OTHER): Payer: BC Managed Care – PPO | Admitting: Hematology

## 2022-10-29 ENCOUNTER — Inpatient Hospital Stay: Payer: BC Managed Care – PPO | Attending: Hematology

## 2022-10-29 VITALS — BP 136/82 | HR 76 | Temp 98.1°F | Resp 17 | Ht 63.0 in | Wt 178.3 lb

## 2022-10-29 DIAGNOSIS — Z170 Estrogen receptor positive status [ER+]: Secondary | ICD-10-CM

## 2022-10-29 DIAGNOSIS — Z853 Personal history of malignant neoplasm of breast: Secondary | ICD-10-CM | POA: Diagnosis present

## 2022-10-29 DIAGNOSIS — M549 Dorsalgia, unspecified: Secondary | ICD-10-CM | POA: Diagnosis not present

## 2022-10-29 DIAGNOSIS — C50512 Malignant neoplasm of lower-outer quadrant of left female breast: Secondary | ICD-10-CM | POA: Diagnosis not present

## 2022-10-29 DIAGNOSIS — M810 Age-related osteoporosis without current pathological fracture: Secondary | ICD-10-CM | POA: Diagnosis not present

## 2022-10-29 DIAGNOSIS — Z9013 Acquired absence of bilateral breasts and nipples: Secondary | ICD-10-CM | POA: Insufficient documentation

## 2022-10-29 LAB — CBC WITH DIFFERENTIAL (CANCER CENTER ONLY)
Abs Immature Granulocytes: 0.03 10*3/uL (ref 0.00–0.07)
Basophils Absolute: 0.1 10*3/uL (ref 0.0–0.1)
Basophils Relative: 1 %
Eosinophils Absolute: 0.2 10*3/uL (ref 0.0–0.5)
Eosinophils Relative: 2 %
HCT: 38.5 % (ref 36.0–46.0)
Hemoglobin: 13.3 g/dL (ref 12.0–15.0)
Immature Granulocytes: 0 %
Lymphocytes Relative: 30 %
Lymphs Abs: 2.3 10*3/uL (ref 0.7–4.0)
MCH: 30.3 pg (ref 26.0–34.0)
MCHC: 34.5 g/dL (ref 30.0–36.0)
MCV: 87.7 fL (ref 80.0–100.0)
Monocytes Absolute: 0.5 10*3/uL (ref 0.1–1.0)
Monocytes Relative: 6 %
Neutro Abs: 4.6 10*3/uL (ref 1.7–7.7)
Neutrophils Relative %: 61 %
Platelet Count: 238 10*3/uL (ref 150–400)
RBC: 4.39 MIL/uL (ref 3.87–5.11)
RDW: 12.7 % (ref 11.5–15.5)
WBC Count: 7.6 10*3/uL (ref 4.0–10.5)
nRBC: 0 % (ref 0.0–0.2)

## 2022-10-29 LAB — LIPID PANEL
Cholesterol: 226 mg/dL — ABNORMAL HIGH (ref 0–200)
HDL: 69 mg/dL (ref >40)
LDL Cholesterol: 139 mg/dL — ABNORMAL HIGH (ref 0–99)
Total CHOL/HDL Ratio: 3.3 {ratio}
Triglycerides: 89 mg/dL (ref <150)
VLDL: 18 mg/dL (ref 0–40)

## 2022-10-29 LAB — CMP (CANCER CENTER ONLY)
ALT: 24 U/L (ref 0–44)
AST: 20 U/L (ref 15–41)
Albumin: 4.5 g/dL (ref 3.5–5.0)
Alkaline Phosphatase: 112 U/L (ref 38–126)
Anion gap: 7 (ref 5–15)
BUN: 12 mg/dL (ref 6–20)
CO2: 30 mmol/L (ref 22–32)
Calcium: 9.8 mg/dL (ref 8.9–10.3)
Chloride: 101 mmol/L (ref 98–111)
Creatinine: 0.76 mg/dL (ref 0.44–1.00)
GFR, Estimated: >60 mL/min (ref >60)
Glucose, Bld: 93 mg/dL (ref 70–99)
Potassium: 4.1 mmol/L (ref 3.5–5.1)
Sodium: 138 mmol/L (ref 135–145)
Total Bilirubin: 0.5 mg/dL (ref 0.3–1.2)
Total Protein: 7.3 g/dL (ref 6.5–8.1)

## 2022-10-29 NOTE — Progress Notes (Signed)
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 S/p bilateral mastectomy.  -she started Tamoxifen in 10/2015. She was switched to Anastrozole in 09/2018 when she became postmenopausal. I recommended she continue anastrozole until 2025, but if she has problems, she can stop early because she took tamoxifen prior. -She developed some chest pain and back pain in early 2023, CT and bone scan was negative for cancer recurrence.    Assessment and Plan    Breast Cancer Patient is 7 years post-diagnosis and has had a double mastectomy. She stopped taking her medication anastrozole in May due to concerns about bone density loss and fatigue. No new symptoms or concerns reported. -Continue surveillance and self-monitoring for signs of recurrence (bone pain, fatigue, unexplained weight loss). -See primary care physician for routine labs and follow-up. -Return to clinic as needed for any new concerns or symptoms.  We discussed the small risk of later recurrence, especially bone metastasis, and what to watch at home.  Osteoporosis Patient stopped anastrozole due to concerns about bone density loss. She is currently taking calcium and vitamin D.  She is not interested in biphosphonate -Continue calcium and vitamin D supplementation. -Follow-up with Dr. Regino Schultze for bone density scans in 2 years  Back Pain Patient reports improvement in back pain since stopping her  medication. -Continue current management and self-monitoring.     Plan -Patient has completed adjuvant anastrozole in May 2024 -She will follow-up with her primary care physician for her bone density -Continue breast cancer surveillance, I will see her as needed.    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   07/15/2015 Imaging   breast MRI w wo contrast showed biopsy proven malignancy in the lower outer left breast measures 1 x 1 x 0.7 cm. No findings to suggest multifocal or multicentric disease. Right breast was negative.   08/24/2015 Surgery   Bilateral breast mastectomy and sentinel lymph node biopsy   08/24/2015 Pathology Results   Left breast mastectomy showed a 1.3 cm  invasive ductal carcinoma, grade 1, low-grade DCIS, LVI (+), surgical margins were negative. 5 left axillary nodes were negative. Right breast mastectomy showed no malignancy, 7 lymph nodes negative.    08/24/2015 Oncotype testing   Recurrence score 8, which predicts 10 year risk of distant  recurrence 6% with tamoxifen   10/02/2015 -  Anti-estrogen oral therapy   Adjuvant tamoxifen 20 mg once daily. Switched to anastrozole on 09/30/18 since postmenopausal.       Discussed the use of AI scribe software for clinical note transcription with the patient, who gave verbal consent to proceed.  History of Present Illness   The patient, a 57 year old female with a history of breast cancer, presents for a follow-up visit. She reports discontinuing her medication in May due to concerns about bone density loss, a known side effect of the medication. She also experienced fatigue while on the medication. Since discontinuing the medication, she reports feeling better and notes an improvement in her back pain. She is currently taking calcium and vitamin D supplements. The patient has been off the medication for more than six months. She has been managing her symptoms with the help of her primary care physician and regular check-ups.         All other systems were reviewed with the patient and are negative.  MEDICAL HISTORY:  Past Medical History:  Diagnosis Date   Cancer (HCC) 2017   left breast cancer   Hyperlipidemia     SURGICAL HISTORY: Past Surgical History:  Procedure Laterality Date   BREAST BIOPSY  06/28/10   left   CHOLECYSTECTOMY     MASTECTOMY W/ SENTINEL NODE BIOPSY Bilateral 08/24/2015   Procedure: BILATERAL TOTAL MASTECTOMY WITH LEFT SENTINEL LYMPH NODE BIOPSY;  Surgeon: Emelia Loron, MD;  Location: [facility];  Service: General;  Laterality: Bilateral;    I have reviewed the social history and family history with the patient and they are unchanged from previous note.  ALLERGIES:  is allergic to simvastatin.  MEDICATIONS:  Current Outpatient Medications  Medication Sig Dispense Refill   aspirin (ASPIRIN LOW DOSE) 81 MG EC tablet Take 1 tablet (81 mg total) by mouth daily. Swallow whole. 90 tablet 3   celecoxib (CELEBREX) 200 MG capsule TAKE 1  CAPSULE AS NEEDED 90 capsule 2   Cholecalciferol (VITAMIN D3) 1000 units CAPS Take by mouth.     DENTA 5000 PLUS 1.1 % CREA dental cream Take 51 g by mouth 2 (two) times daily.     diphenhydrAMINE (SOMINEX) 25 MG tablet Take by mouth.     Melatonin 10 MG TABS Take 10 mg by mouth at bedtime.     Multiple Vitamin (MULTIVITAMIN PO) Take by mouth daily.       Omega-3 Fatty Acids (FISH OIL) 1200 MG CAPS Take 1 capsule by mouth daily.     simvastatin (ZOCOR) 20 MG tablet Take 20 mg by mouth daily.     No current facility-administered medications for this visit.    PHYSICAL EXAMINATION: ECOG PERFORMANCE STATUS: 0 - Asymptomatic  Vitals:   10/29/22 1436  BP: 136/82  Pulse: 76  Resp: 17  Temp: 98.1 F (36.7 C)  SpO2: 99%   Wt Readings from Last 3 Encounters:  10/29/22 178 lb 4.8 oz (80.9 kg)  10/26/21 179 lb 1.6 oz (81.2 kg)  04/27/21 176 lb 3 oz (79.9 kg)     GENERAL:alert, no distress and comfortable SKIN: skin color, texture, turgor are normal, no rashes or significant lesions EYES:  normal, Conjunctiva are pink and non-injected, sclera clear NECK: supple, thyroid normal size, non-tender, without nodularity LYMPH:  no palpable lymphadenopathy in the cervical, axillary  LUNGS: clear to auscultation and percussion with normal breathing effort HEART: regular rate & rhythm and no murmurs and no lower extremity edema ABDOMEN:abdomen soft, non-tender and normal bowel sounds Musculoskeletal:no cyanosis of digits and no clubbing  NEURO: alert & oriented x 3 with fluent speech, no focal motor/sensory deficits BREAST: Status post about mastectomy.  No lumps palpated, tenderness noted on right side, scar tissue on chest wall, asymmetry with left side thinner than right.     LABORATORY DATA:  I have reviewed the data as listed    Latest Ref Rng & Units 10/29/2022    2:08 PM 10/26/2021    1:48 PM 04/27/2021    2:13 PM  CBC  WBC 4.0 - 10.5 K/uL 7.6  6.8  7.2   Hemoglobin 12.0 - 15.0  g/dL 16.1  09.6  04.5   Hematocrit 36.0 - 46.0 % 38.5  40.6  39.5   Platelets 150 - 400 K/uL 238  225  265         Latest Ref Rng & Units 10/29/2022    2:08 PM 10/26/2021    1:48 PM 04/27/2021    2:13 PM  CMP  Glucose 70 - 99 mg/dL 93  90  409   BUN 6 - 20 mg/dL 12  15  14    Creatinine 0.44 - 1.00 mg/dL 8.11  9.14  7.82   Sodium 135 - 145 mmol/L 138  139  138   Potassium 3.5 - 5.1 mmol/L 4.1  4.4  4.1   Chloride 98 - 111 mmol/L 101  103  102   CO2 22 - 32 mmol/L 30  31  30    Calcium 8.9 - 10.3 mg/dL 9.8  9.6  9.8   Total Protein 6.5 - 8.1 g/dL 7.3  7.3  7.5   Total Bilirubin 0.3 - 1.2 mg/dL 0.5  0.3  0.5   Alkaline Phos 38 - 126 U/L 112  136  134   AST 15 - 41 U/L 20  19  23    ALT 0 - 44 U/L 24  23  27        RADIOGRAPHIC STUDIES: I have personally reviewed the radiological images as listed and agreed with the findings in the report. No results found.    No orders of the defined types were placed in this encounter.  All questions were answered. The patient knows to call the clinic with any problems, questions or concerns. No barriers to learning was detected. The total time spent in the appointment was 20 minutes.     Malachy Mood, MD 10/29/2022

## 2022-10-29 NOTE — Assessment & Plan Note (Signed)
IDC and DCIS, grade 1, pT1cN0M0, stage IA, ER+/PR+, HER-2-, low RS  -diagnosed in 07/2015. S/p bilateral mastectomy.  -she started Tamoxifen in 10/2015. She was switched to Anastrozole in 09/2018 when she became postmenopausal. I recommended she continue anastrozole until 2025, but if she has problems, she can stop early because she took tamoxifen prior. -She developed some chest pain and back pain in early 2023, CT and bone scan was negative for cancer recurrence.

## 2022-11-14 ENCOUNTER — Other Ambulatory Visit: Payer: Self-pay

## 2022-11-14 ENCOUNTER — Other Ambulatory Visit: Payer: Self-pay | Admitting: Hematology

## 2022-11-15 ENCOUNTER — Encounter: Payer: Self-pay | Admitting: Hematology

## 2022-11-16 ENCOUNTER — Other Ambulatory Visit: Payer: Self-pay

## 2022-11-16 MED ORDER — CELECOXIB 200 MG PO CAPS: ORAL_CAPSULE | ORAL | 2 refills | Status: AC

## 2022-11-26 ENCOUNTER — Telehealth: Payer: Self-pay

## 2022-11-26 NOTE — Telephone Encounter (Addendum)
Called patient and relayed message below as per Dr. Mosetta Putt. Patient voiced full understanding. I sent her results over to her PCP.    ----- Message from Malachy Mood sent at 11/25/2022  1:25 PM EST ----- Please send her lab result to her PCP and let pt know that her PCP needs to interpret her lipid panel result, thanks ''  Malachy Mood
